# Patient Record
Sex: Male | Born: 1980 | Race: White | Hispanic: No | Smoking: Current every day smoker
Health system: Southern US, Community
[De-identification: ages and names within clinical notes are randomized; demographics above are authoritative.]

---

## 2011-08-07 ENCOUNTER — Other Ambulatory Visit: Payer: Self-pay | Admitting: Internal Medicine

## 2011-08-07 DIAGNOSIS — R748 Abnormal levels of other serum enzymes: Secondary | ICD-10-CM

## 2011-08-13 ENCOUNTER — Ambulatory Visit
Admission: RE | Admit: 2011-08-13 | Discharge: 2011-08-13 | Disposition: A | Discharge status: Home / Self Care | Payer: 59 | Source: Ambulatory Visit | Attending: Internal Medicine | Admitting: Internal Medicine

## 2011-08-13 DIAGNOSIS — R748 Abnormal levels of other serum enzymes: Secondary | ICD-10-CM

## 2013-05-31 IMAGING — US US ABDOMEN COMPLETE
1 series · 14 of 25 positions shown · non-contrast
Comparison: None.

CLINICAL DATA: Elevated LFTs

COMPLETE ABDOMINAL ULTRASOUND

[Series 1: us abdomen complete · 0.34mm/px · 14 of 84 slices shown]
[im 1/84]
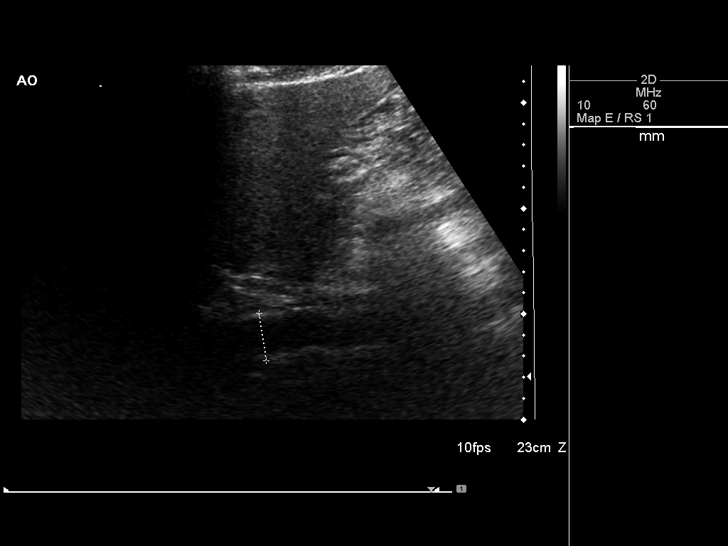
[im 7/84]
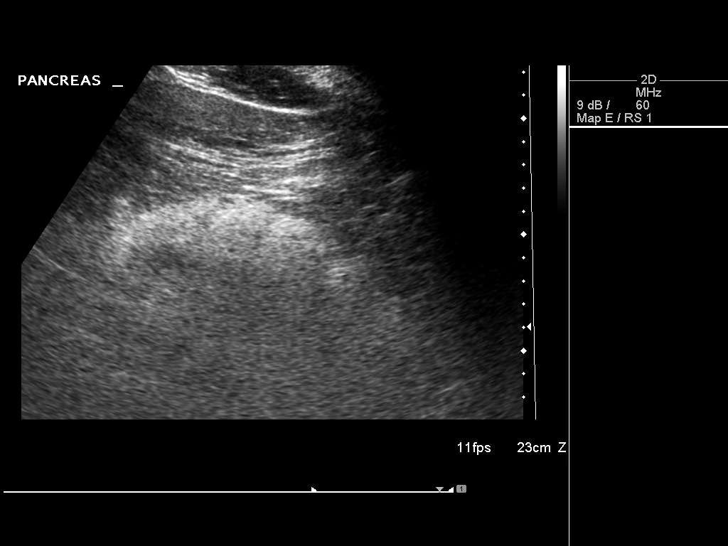
[im 14/84]
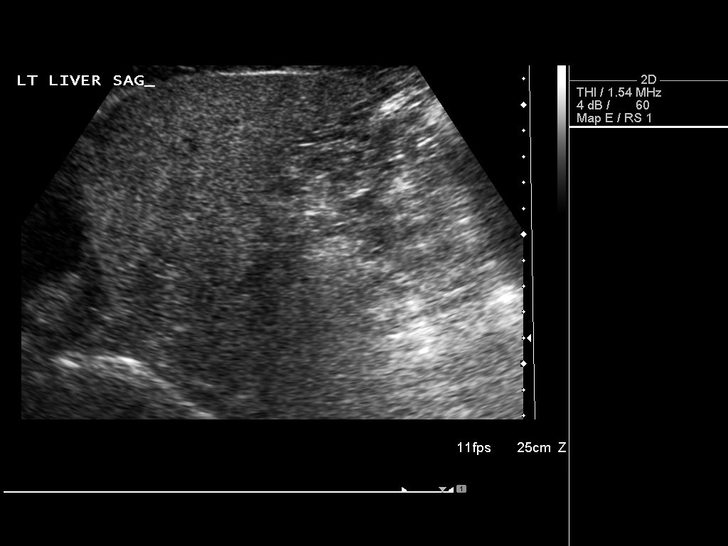
[im 21/84]
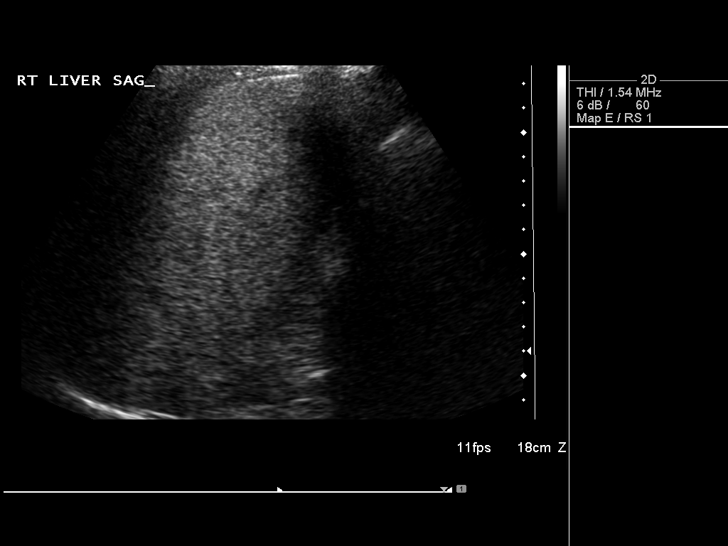
[im 28/84]
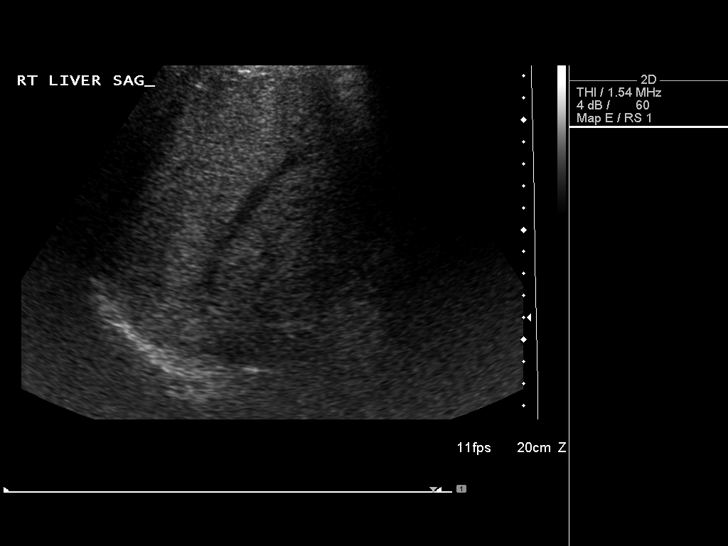
[im 32/84]
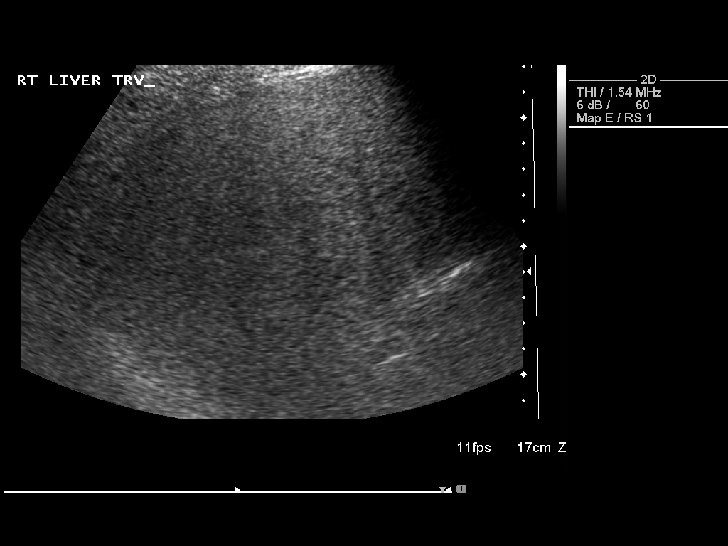
[im 39/84]
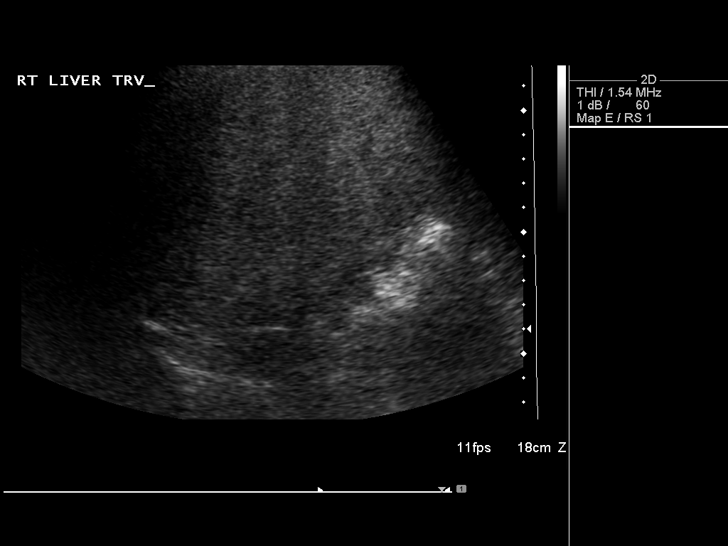
[im 45/84]
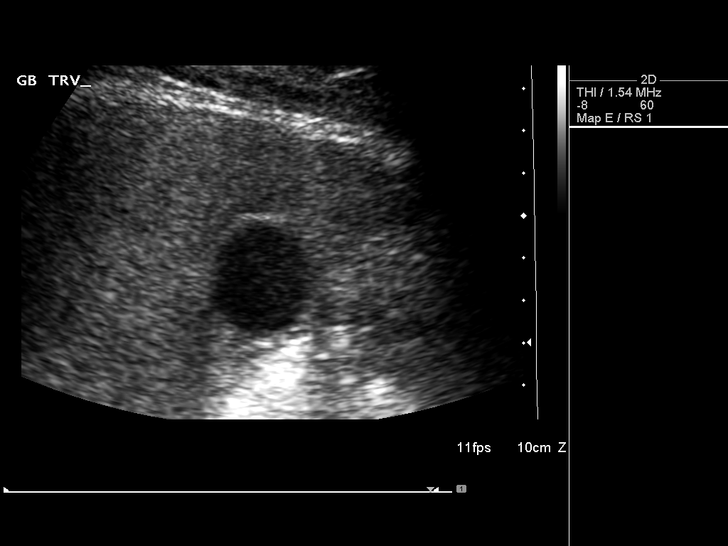
[im 52/84]
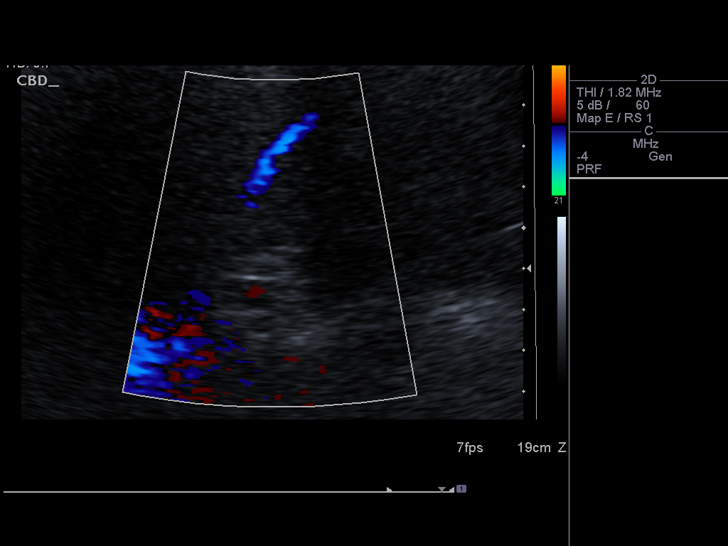
[im 56/84]
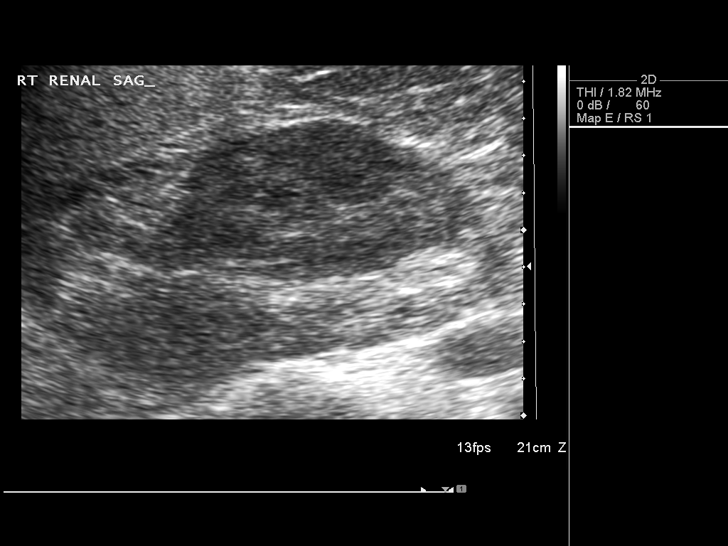
[im 63/84]
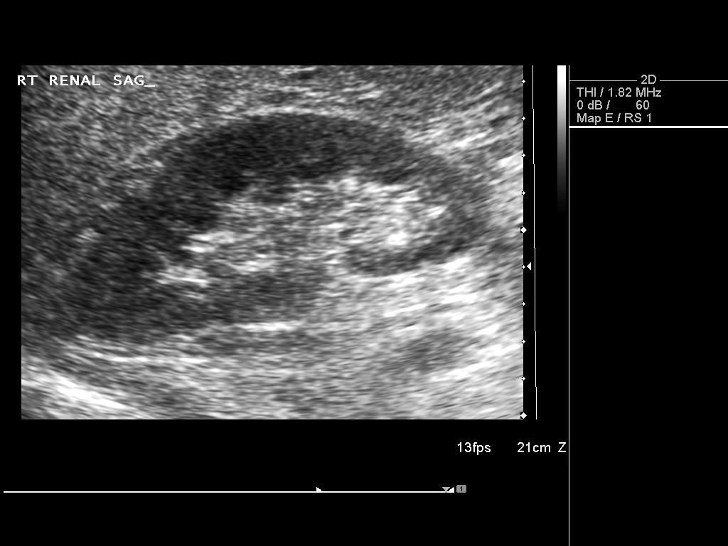
[im 70/84]
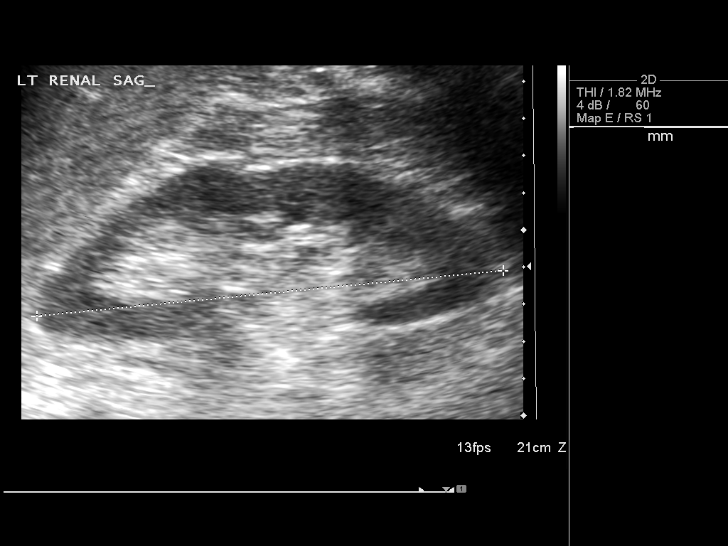
[im 77/84]
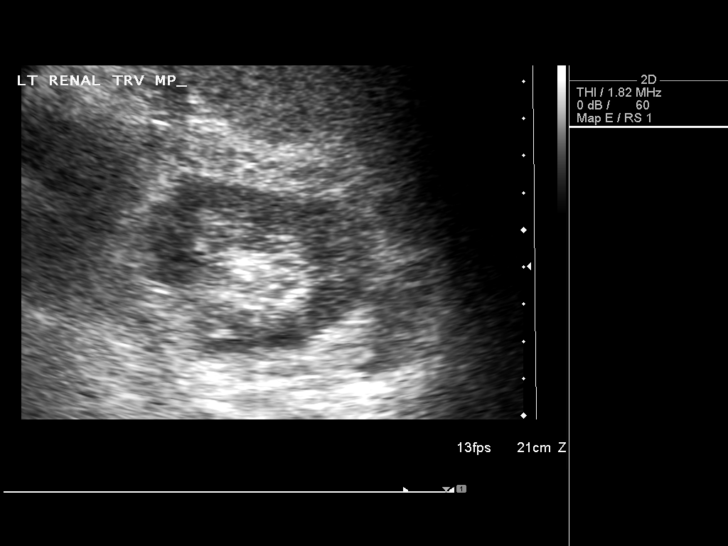
[im 84/84]
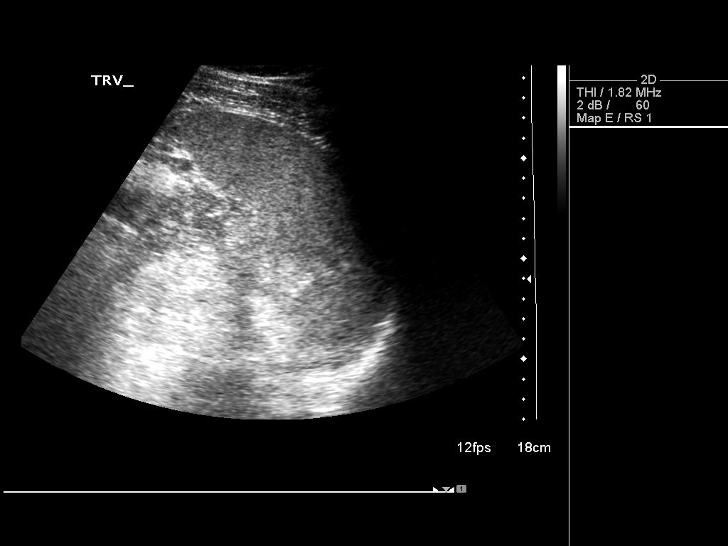

[14 of 25 positions shown; findings below may reference images not displayed]

FINDINGS: Gallbladder:  No shadowing gallstones or echogenic sludge.  No
gallbladder wall thickening or pericholecystic fluid.  Negative
sonographic Murphy's sign according to the ultrasound technologist.

Common bile duct:  3.5 mm diameter, unremarkable

Liver:  Normal size and echotexture without focal parenchymal
abnormality.  Patent portal vein with hepatopetal flow.

IVC:  Appears normal.

Pancreas:  Although the pancreas is difficult to visualize in its
entirety, no focal pancreatic abnormality is identified.

Spleen:  9.4 cm craniocaudal length, unremarkable.

Right Kidney:  11.9 cm. No hydronephrosis.  Well-preserved cortex.
Normal size and parenchymal echotexture without focal
abnormalities.

Left Kidney:  12.6 cm. No hydronephrosis.  Well-preserved cortex.
Normal size and parenchymal echotexture without focal
abnormalities. The

Abdominal aorta:  2.4 cm maximal diameter, segments obscured by
overlying bowel gas.
IMPRESSION: 1.  Negative.

## 2018-07-02 ENCOUNTER — Encounter: Payer: Self-pay | Admitting: Physician Assistant

## 2018-07-02 ENCOUNTER — Ambulatory Visit (INDEPENDENT_AMBULATORY_CARE_PROVIDER_SITE_OTHER): Payer: 59 | Admitting: Physician Assistant

## 2018-07-02 ENCOUNTER — Other Ambulatory Visit: Payer: Self-pay

## 2018-07-02 VITALS — BP 147/98 | HR 90 | Temp 98.7°F | Resp 18 | Ht 74.0 in | Wt 225.6 lb

## 2018-07-02 DIAGNOSIS — I10 Essential (primary) hypertension: Secondary | ICD-10-CM | POA: Diagnosis not present

## 2018-07-02 DIAGNOSIS — F411 Generalized anxiety disorder: Secondary | ICD-10-CM | POA: Insufficient documentation

## 2018-07-02 DIAGNOSIS — G4730 Sleep apnea, unspecified: Secondary | ICD-10-CM | POA: Diagnosis not present

## 2018-07-02 MED ORDER — OLMESARTAN MEDOXOMIL 20 MG PO TABS: 20.0000 mg | ORAL_TABLET | Freq: Every day | ORAL | 2 refills | Status: AC

## 2018-07-02 MED ORDER — SERTRALINE HCL 50 MG PO TABS: 50.0000 mg | ORAL_TABLET | Freq: Every day | ORAL | 1 refills | Status: AC

## 2018-07-02 NOTE — Progress Notes (Signed)
Nicholas Alvarado  MRN: [id number] DOB: [birth year]  PCP: Patient, No Pcp Per  Chief Complaint  Patient presents with  . Anxiety  . Depression    zoloft   . Hypertension  . Neck Pain    popping of the neck     Subjective:  Pt presents to clinic for medication initiation for generalized anxiety disorder.  He [facility].  Dx with GAD about 1.5 years ago - feeling of unease - uncomfortable inside his body.  Upcoming wedding, engagement party end of Sept Caregiver of finance -- problems with clutter and he knows that is starting in his house with all the event coming up.  Wellbutrin in the past for smoking sensation - but did help his mood  Gets frustrated easily which can lead to anger No SI/HI  No social anxiety.  Trouble sleeping - due to fiance not sleeping well.  She tends to sleep a lot during the day and then is unable to sleep at night.  She does a lot of watching television in bed.  They just recently had a conversation that if she is not ready to go to bed by certain time then he will go into the gastrin because of sleep is important to him, as he needs to get up for work.  EOTH - 2 drinks a day - likes beer taste - does not use it as a treatment Smoke - 1/4-1/2 ppd - uses for anxiety - if he gets frustrated leaves the situation to smoke Drugs  - occ marj - social - once every 2 months  Mom - anxiety problems  He is also worried about his blood pressure.  At last several visits with other providers he has been told his blood pressure is high but no one ever initiates medications.  Spoke with Noah Delaine.  She has been seeing patients for generalized anxiety.  He has been on Wellbutrin in the past which he felt like it helped.  He has a lot of situational stressors with upcoming wedding and fianc with multiple medical problems.  She feels like he would benefit from medication though concerned about  Wellbutrin with his current anxiety state.   History is obtained by patient.  Review of Systems  Psychiatric/Behavioral: Positive for sleep disturbance (Due to fianc being awake and active in bed at night.). The patient is nervous/anxious.     Patient Active Problem List   Diagnosis Date Noted  . GAD (generalized anxiety disorder) 07/02/2018  . HTN (hypertension) 07/02/2018  . Sleep apnea 07/02/2018    Current Outpatient Medications on File Prior to Visit  Medication Sig Dispense Refill  . finasteride (PROPECIA) 1 MG tablet Take 1 mg by mouth daily.    . fluticasone (FLONASE) 50 MCG/ACT nasal spray Place into the nose.    . minoxidil (ROGAINE) 2 % external solution Apply topically 2 (two) times daily.    Marland Kitchen omeprazole (PRILOSEC) 20 MG capsule Take by mouth.     No current facility-administered medications on file prior to visit.     No Known Allergies  No past medical history on file. Social History   Social History Narrative  . Not on file   Social History   Tobacco Use  . Smoking status: Current Every Day Smoker    Types: Cigarettes  . Smokeless tobacco: Never Used  Substance Use Topics  . Alcohol use: Not on file  . Drug use:  Not on file   family history is not on file.     Objective:  BP (!) 147/98   Pulse 90   Temp 98.7 F (37.1 C) (Oral)   Resp 18   Ht _0  (1.88 m)   Wt 225 lb 9.6 oz (102.3 kg)   SpO2 98%   BMI 28.97 kg/m  Body mass index is 28.97 kg/m.  Wt Readings from Last 3 Encounters:  07/02/18 225 lb 9.6 oz (102.3 kg)    Physical Exam  Constitutional: He is oriented to person, place, and time. He appears well-developed and well-nourished.  HENT:  Head: Normocephalic and atraumatic.  Right Ear: External ear normal.  Left Ear: External ear normal.  Eyes: Conjunctivae are normal.  Neck: Normal range of motion.  Cardiovascular: Normal rate, regular rhythm and normal heart sounds.  No murmur heard. Pulmonary/Chest: Effort normal and  breath sounds normal. He has no wheezes.  Neurological: He is alert and oriented to person, place, and time.  Skin: Skin is warm and dry.  Psychiatric: Judgment normal.  Vitals reviewed.   Assessment and Plan :  GAD (generalized anxiety disorder) - Plan: TSH, sertraline (ZOLOFT) 50 MG tablet  Essential hypertension - Plan: CMP14+EGFR, TSH, olmesartan (BENICAR) 20 MG tablet  Sleep apnea, unspecified type   Check labs.  Initiate Zoloft for generalized anxiety disorder expect to need to increase dose over the next 4 to 6 weeks.  Start Benicar 20 mg for blood pressure.  Recheck with me at new clinic.  Continue therapy.  Patient verbalized to me that they understand the following: diagnosis, what is being done for them, what to expect and what should be done at home.  Their questions have been answered.  See after visit summary for patient specific instructions.  Windell Hummingbird PA-C  [facility] 07/08/2018 9:56 PM  Please note: Portions of this report may have been transcribed using dragon voice recognition software. Every effort was made to ensure accuracy; however, inadvertent computerized transcription errors may be present.

## 2018-07-02 NOTE — Patient Instructions (Addendum)
Zoloft - start with a 1/2 pill for 6 days - if tolerating well increase to a full pill  For your BP - start benicar 20mg  1 a day -   [facility] - [address], [place], [place] [address] Phone: [phone]   If you have lab work done today you will be contacted with your lab results within the next 2 weeks.  If you have not heard from [place] then please contact us. The fastest way to get your results is to register for My Chart.   IF you received an x-ray today, you will receive an invoice from [facility]. Please contact [facility] at [phone] with questions or concerns regarding your invoice.   IF you received labwork today, you will receive an invoice from [facility]. Please contact [facility] at [phone] with questions or concerns regarding your invoice.   Our billing staff will not be able to assist you with questions regarding bills from these companies.  You will be contacted with the lab results as soon as they are available. The fastest way to get your results is to activate your My Chart account. Instructions are located on the last page of this paperwork. If you have not heard from [place] regarding the results in 2 weeks, please contact this office.

## 2018-07-03 LAB — CMP14+EGFR
ALT: 34 IU/L (ref 0–44)
AST: 20 IU/L (ref 0–40)
Albumin/Globulin Ratio: 1.8 (ref 1.2–2.2)
Albumin: 4.6 g/dL (ref 3.5–5.5)
Alkaline Phosphatase: 64 IU/L (ref 39–117)
BUN/Creatinine Ratio: 11 (ref 9–20)
BUN: 12 mg/dL (ref 6–20)
Bilirubin Total: 1 mg/dL (ref 0.0–1.2)
CALCIUM: 9.6 mg/dL (ref 8.7–10.2)
CO2: 22 mmol/L (ref 20–29)
Chloride: 104 mmol/L (ref 96–106)
Creatinine, Ser: 1.06 mg/dL (ref 0.76–1.27)
GFR, EST AFRICAN AMERICAN: 104 mL/min/{1.73_m2} (ref >59)
GFR, EST NON AFRICAN AMERICAN: 90 mL/min/{1.73_m2} (ref >59)
GLUCOSE: 97 mg/dL (ref 65–99)
Globulin, Total: 2.6 g/dL (ref 1.5–4.5)
Potassium: 4.4 mmol/L (ref 3.5–5.2)
Sodium: 144 mmol/L (ref 134–144)
TOTAL PROTEIN: 7.2 g/dL (ref 6.0–8.5)

## 2018-07-03 LAB — TSH: TSH: 1.23 u[IU]/mL (ref 0.450–4.500)

## 2023-07-23 ENCOUNTER — Other Ambulatory Visit: Payer: Self-pay

## 2023-07-23 ENCOUNTER — Ambulatory Visit (HOSPITAL_BASED_OUTPATIENT_CLINIC_OR_DEPARTMENT_OTHER): Payer: BC Managed Care – PPO | Attending: Physician Assistant | Admitting: Physical Therapy

## 2023-07-23 DIAGNOSIS — M542 Cervicalgia: Secondary | ICD-10-CM

## 2023-07-23 DIAGNOSIS — M5030 Other cervical disc degeneration, unspecified cervical region: Secondary | ICD-10-CM | POA: Diagnosis present

## 2023-07-23 NOTE — Therapy (Signed)
OUTPATIENT PHYSICAL THERAPY CERVICAL EVALUATION   Patient Name: Nicholas Alvarado MRN: [id number] DOB:[birth year], 42 y.o., male Today's Date: 07/24/2023  END OF SESSION:  PT End of Session - 07/23/23 1500     Visit Number 1    Number of Visits 8    Date for PT Re-Evaluation 09/03/23    Authorization Type BCBS    PT Start Time 1322    PT Stop Time 1413    PT Time Calculation (min) 51 min    Activity Tolerance Patient tolerated treatment well    Behavior During Therapy [facility] for tasks assessed/performed             No past medical history on file. *** The histories are not reviewed yet. Please review them in the "History" navigator section and refresh this SmartLink. Patient Active Problem List   Diagnosis Date Noted   GAD (generalized anxiety disorder) 07/02/2018   HTN (hypertension) 07/02/2018   Sleep apnea 07/02/2018     REFERRING PROVIDER: Morrell Riddle, PA-C  REFERRING DIAG: M50.30 (ICD-10-CM) - Other cervical disc degeneration, unspecified cervical region  THERAPY DIAG:  Cervicalgia  Rationale for Evaluation and Treatment: Rehabilitation  ONSET DATE: PT order 06/18/2023  SUBJECTIVE:                                                                                                                                                                                                         SUBJECTIVE STATEMENT: Pt reports having stiffness and pain in cervical for years.  He denies any specific MOI.  He states the pain is in his muscles, not as much the spine.  Pt states he gets a lot of tension in his neck and upper traps which he seems to not be able to get rid of.  Pt states he has to pop his neck constantly a lot to relieve the tension.  He is limited with turning head when he is hurting.  Pt has pain with turning head while driving.  He feels pressure in neck with sitting.  Pt states he can sit 15 mins before he starts having the pressure.  Pt received PT at [facility] last  year and reports the dry needling helped.  He has a [employer] home cervical traction unit.      Pt performed the [employer] program which is an app for online PT.  He states he received very slight benefits though did learn some stretches.    Pt is a member at [employer] and works out in [employer] 2x/wk.  He has a  program from a trainer incorporating lower and upper body workouts.     Hand dominance: Right  PERTINENT HISTORY:  HTN, GAD  PAIN:  NPRS:  Pt states he mainly stays at a 3/10 constant pain/level of discomfort which can increase to 6-7/10.  Worst 6-7/10. Best 3/10. Location: cervical/UT Pt can occasionally have L shoulder pain.  Pt can have tingling in L UE in sitting and also playing video games.   Easing Factors:  naproxen, CBD cream, traction unit, self-popping his neck Aggravating factors:  sitting, turning neck, occasionally reaching up to grab an object  PRECAUTIONS: None     WEIGHT BEARING RESTRICTIONS: No  FALLS:  Has patient fallen in last 6 months? 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  Pt tries to get up every hour.  PLOF: Independent  PATIENT GOALS: reduce daily uncomfortable feeling.  Have tools to decrease pain.  Decrease the tension in neck/shoulders.  Reduce the need to pop his neck.   OBJECTIVE:  DIAGNOSTIC FINDINGS:   Note:  X rays in 10/23:   FINDINGS: There is straightening of the cervical spine which is similar to the prior exam. The vertebral body heights are maintained. No fracture identified. Mild disc space narrowing with anterior osteophytes at C5-C6. Mild disc space narrowing at C6-C7.  No prevertebral soft tissue swelling.   IMPRESSION: Degenerative disc disease at C5-C6 and C6-C7 which is similar to the prior exam.     PATIENT SURVEYS:  FOTO 62 with a goal of 70 at visit 11  COGNITION: Overall cognitive status: Within functional limits for tasks assessed  SENSATION: LT 2+ t/o bilat UE  dermatomes except 1+ on L C6   PALPATION: Soft tissue tightness present in bilat UT.    CERVICAL ROM:   Active ROM A/PROM (deg) eval  Flexion WNL  Extension 60  Right lateral flexion 40  Left lateral flexion 37  Right rotation WNL pain in L sided cervical  Left rotation WNL pain in L sided cervical   (Blank rows = not tested)  UPPER EXTREMITY ROM:  Active ROM Right eval Left eval  Shoulder flexion [facility]  Shoulder extension    Shoulder abduction [facility]  Shoulder adduction    Shoulder extension    Shoulder internal rotation    Shoulder external rotation    Elbow flexion    Elbow extension    Wrist flexion    Wrist extension    Wrist ulnar deviation    Wrist radial deviation    Wrist pronation    Wrist supination     (Blank rows = not tested)  UPPER EXTREMITY MMT:  MMT Right eval Left eval  Shoulder flexion 5/5 5/5  Shoulder extension    Shoulder abduction 5/5 5/5  Shoulder adduction    Shoulder extension    Shoulder internal rotation    Shoulder external rotation    Middle trapezius    Lower trapezius    Elbow flexion 5/5 5/5  Elbow extension    Wrist flexion    Wrist extension    Wrist ulnar deviation    Wrist radial deviation    Wrist pronation    Wrist supination    Grip strength     (Blank rows = not tested)  CERVICAL SPECIAL TESTS:  Compression test: negative Spurlings Test:  Pain on L side with L sided testing and pain on R side with R sided testing.  Pt denies any shooting  pain or radicular sx's.   TODAY'S TREATMENT:                                                                                                                               Pt demonstrated his home program.  He is performing cervical AROM in flexion, extension, SB, and rotation with 4 sec holds at home.  PATIENT EDUCATION:  Education details: PT educated pt with using a lumbar towel roll in chair for improved posture.  PT demonstrated correct posture and educated pt  in the importance of correct posture.  PT educated pt in using a theracane.  Dx, objective findings, rationale of interventions, POC, and relevant anatomy. Person educated: Patient Education method: Medical illustrator Education comprehension: verbalized understanding, returned demonstration, and needs further education  HOME EXERCISE PROGRAM: Will give next visit  ASSESSMENT:  CLINICAL IMPRESSION: Patient is a 42 y.o. male with a dx of cervical disc degeneration presenting to the clinic   He is limited with turning head when he is hurting.  Pt has pain with turning head while driving.  He feels pressure in neck with sitting.  Pt states he can sit 15 mins before he starts having the pressure. sitting, turning neck, occasionally reaching up to grab an object  OBJECTIVE IMPAIRMENTS: {opptimpairments:25111}.   ACTIVITY LIMITATIONS: {activitylimitations:27494}  PARTICIPATION LIMITATIONS: {participationrestrictions:25113}  PERSONAL FACTORS: {Personal factors:25162} are also affecting patient's functional outcome.   REHAB POTENTIAL: Good  CLINICAL DECISION MAKING: Stable/uncomplicated  EVALUATION COMPLEXITY: Low   GOALS:   SHORT TERM GOALS: Target date: ***  *** Baseline:  Goal status: INITIAL  2.  *** Baseline:  Goal status: INITIAL  3.  *** Baseline:  Goal status: INITIAL  4.  *** Baseline:  Goal status: INITIAL  5.  *** Baseline:  Goal status: INITIAL  6.  *** Baseline:  Goal status: INITIAL  LONG TERM GOALS: Target date: ***  *** Baseline:  Goal status: INITIAL  2.  *** Baseline:  Goal status: INITIAL  3.  *** Baseline:  Goal status: INITIAL  4.  *** Baseline:  Goal status: INITIAL  5.  *** Baseline:  Goal status: INITIAL  6.  *** Baseline:  Goal status: INITIAL   PLAN:  PT FREQUENCY: 1-2x/week  PT DURATION: 6 weeks  PLANNED INTERVENTIONS: {rehab planned interventions:25118::"Therapeutic exercises","Therapeutic  activity","Neuromuscular re-education","Balance training","Gait training","Patient/Family education","Self Care","Joint mobilization"}  PLAN FOR NEXT SESSION: Aaron Edelman, PT 07/24/2023, 9:10 PM

## 2023-07-25 ENCOUNTER — Encounter (HOSPITAL_BASED_OUTPATIENT_CLINIC_OR_DEPARTMENT_OTHER): Payer: Self-pay | Admitting: Physical Therapy

## 2023-08-01 ENCOUNTER — Ambulatory Visit (HOSPITAL_BASED_OUTPATIENT_CLINIC_OR_DEPARTMENT_OTHER): Payer: BC Managed Care – PPO | Admitting: Physical Therapy

## 2023-08-01 ENCOUNTER — Encounter (HOSPITAL_BASED_OUTPATIENT_CLINIC_OR_DEPARTMENT_OTHER): Payer: Self-pay | Admitting: Physical Therapy

## 2023-08-01 DIAGNOSIS — M5030 Other cervical disc degeneration, unspecified cervical region: Secondary | ICD-10-CM | POA: Diagnosis not present

## 2023-08-01 DIAGNOSIS — M542 Cervicalgia: Secondary | ICD-10-CM

## 2023-08-01 NOTE — Therapy (Signed)
OUTPATIENT PHYSICAL THERAPY TREATMENT   Patient Name: Nicholas Alvarado MRN: [id number] DOB:February 08, 1981, 42 y.o., male Today's Date: 08/01/2023  END OF SESSION:  PT End of Session - 08/01/23 1150     Visit Number 2    Number of Visits 8    Date for PT Re-Evaluation 09/03/23    Authorization Type BCBS    PT Start Time 1150    PT Stop Time 1230    PT Time Calculation (min) 40 min    Activity Tolerance Patient tolerated treatment well    Behavior During Therapy [facility] for tasks assessed/performed             History reviewed. No pertinent past medical history.  Patient Active Problem List   Diagnosis Date Noted   GAD (generalized anxiety disorder) 07/02/2018   HTN (hypertension) 07/02/2018   Sleep apnea 07/02/2018     REFERRING PROVIDER: Morrell Riddle, PA-C  REFERRING DIAG: M50.30 (ICD-10-CM) - Other cervical disc degeneration, unspecified cervical region  THERAPY DIAG:  Cervicalgia  Rationale for Evaluation and Treatment: Rehabilitation  ONSET DATE: PT order 06/18/2023  SUBJECTIVE:                                                                                                                                                                                                         SUBJECTIVE STATEMENT: Patient states continued symptoms from the base of scull to UT region.    EVAL: Pt reports having stiffness and pain in cervical for years.  He denies any specific MOI.  He states the pain is in his muscles, not as much the spine.  Pt states he gets a lot of tension in his neck and upper traps which he seems to not be able to get rid of.  Pt states he has to pop his neck constantly a lot to relieve the tension.  He is limited with turning head when he is hurting.  Pt has pain with turning head while driving.  He feels pressure in neck with sitting.  Pt states he can sit 15 mins before he starts having the pressure.  Pt received PT at [facility] last year and reports the dry  needling helped.  He has a [employer] home cervical traction unit.      Pt performed the [employer] program which is an app for online PT.  He states he received very slight benefits though did learn some stretches.    Pt is a member at [employer] and works out in [employer] 2x/wk.  He has a program from  a trainer incorporating lower and upper body workouts.     Hand dominance: Right  PERTINENT HISTORY:  HTN, GAD  PAIN:  NPRS:  Current 5 /10Worst 6-7/10. Best 3/10. Location: cervical/UT Pt can occasionally have L shoulder pain.  Pt can have tingling in L UE in sitting and also playing video games.   Easing Factors:  naproxen, CBD cream, traction unit, self-popping his neck Aggravating factors:  sitting, turning neck, occasionally reaching up to grab an object  PRECAUTIONS: None     WEIGHT BEARING RESTRICTIONS: No  FALLS:  Has patient fallen in last 6 months? 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  Pt tries to get up every hour.  PLOF: Independent  PATIENT GOALS: reduce daily uncomfortable feeling.  Have tools to decrease pain.  Decrease the tension in neck/shoulders.  Reduce the need to pop his neck.   OBJECTIVE:  DIAGNOSTIC FINDINGS:   Note:  X rays in 10/23:   FINDINGS: There is straightening of the cervical spine which is similar to the prior exam. The vertebral body heights are maintained. No fracture identified. Mild disc space narrowing with anterior osteophytes at C5-C6. Mild disc space narrowing at C6-C7.  No prevertebral soft tissue swelling.   IMPRESSION: Degenerative disc disease at C5-C6 and C6-C7 which is similar to the prior exam.     PATIENT SURVEYS:  FOTO 62 with a goal of 70 at visit 11  COGNITION: Overall cognitive status: Within functional limits for tasks assessed  SENSATION: LT 2+ t/o bilat UE dermatomes except 1+ on L C6   PALPATION: Soft tissue tightness present in bilat UT.    CERVICAL ROM:   Active  ROM A/PROM (deg) eval  Flexion WNL  Extension 60  Right lateral flexion 40  Left lateral flexion 37  Right rotation WNL pain in L sided cervical  Left rotation WNL pain in L sided cervical   (Blank rows = not tested)  UPPER EXTREMITY ROM:  Active ROM Right eval Left eval  Shoulder flexion [facility]  Shoulder extension    Shoulder abduction [facility]  Shoulder adduction    Shoulder extension    Shoulder internal rotation    Shoulder external rotation    Elbow flexion    Elbow extension    Wrist flexion    Wrist extension    Wrist ulnar deviation    Wrist radial deviation    Wrist pronation    Wrist supination     (Blank rows = not tested)  UPPER EXTREMITY MMT:  MMT Right eval Left eval  Shoulder flexion 5/5 5/5  Shoulder extension    Shoulder abduction 5/5 5/5  Shoulder adduction    Shoulder extension    Shoulder internal rotation    Shoulder external rotation    Middle trapezius    Lower trapezius    Elbow flexion 5/5 5/5  Elbow extension    Wrist flexion    Wrist extension    Wrist ulnar deviation    Wrist radial deviation    Wrist pronation    Wrist supination    Grip strength     (Blank rows = not tested)  CERVICAL SPECIAL TESTS:  Compression test: negative Spurlings Test:  Pain on L side with L sided testing and pain on R side with R sided testing.  Pt denies any shooting pain or radicular sx's.   TODAY'S TREATMENT:  08/01/23 TTP bilateral cervical paraspinals  and UT  Manual: STM to bilateral UT cervical paraspinals pre and post dry needling for trigger point identification and muscular relaxation. Trigger Point Dry-Needling  Treatment instructions: Expect mild to moderate muscle soreness. S/S of pneumothorax if dry needled over a lung field, and to seek immediate medical attention should they occur. Patient verbalized  understanding of these instructions and education.  Patient Consent Given: Yes Education handout provided: Yes Muscles treated: bilateral UT, R cervical paraspinals Electrical stimulation performed: No Parameters: N/A Treatment response/outcome: twitch response   Seated SNAG rotation 10 x 5 second holds bilateral Seated cervical retraction 2 x 10  Seated UT stretch 3 x 20 second holds bilateral  EVAL Pt demonstrated his home program.  He is performing cervical AROM in flexion, extension, SB, and rotation with 4 sec holds at home. See below for pt education.  PATIENT EDUCATION:  Education details: PT educated pt with using a lumbar towel roll in chair for improved posture.  PT demonstrated correct posture and educated pt in the importance of correct posture.  PT educated pt in using a theracane.  Dx, objective findings, rationale of interventions, POC, and relevant anatomy. Person educated: Patient Education method: Medical illustrator Education comprehension: verbalized understanding, returned demonstration, and needs further education  HOME EXERCISE PROGRAM: Access Code: [email or web address] URL: [email or web address] Date: 08/01/2023  - Seated Assisted Cervical Rotation with Towel  - 3 x daily - 7 x weekly - 10 reps - 5 second hold - Seated Cervical Retraction  - 3-5 x daily - 7 x weekly - 2 sets - 10 reps - Seated Upper Trapezius Stretch  - 3 x daily - 7 x weekly - 3 reps - 20 second hold  ASSESSMENT:  CLINICAL IMPRESSION: Patient with hyperactive and tender UT and cervical paraspinals. Performed dry needling with decrease in tissue tension, twitch response and decrease in symptoms to 2/10. Cervical mobility and postural strengthening tolerated well.  Patient will continue to benefit from physical therapy in order to improve function and reduce impairment.   OBJECTIVE IMPAIRMENTS: decreased ROM, increased fascial restrictions, impaired flexibility, impaired UE  functional use, and pain.   ACTIVITY LIMITATIONS: sitting and reach over head  PARTICIPATION LIMITATIONS: driving and occupation  PERSONAL FACTORS: Time since onset of injury/illness/exacerbation are also affecting patient's functional outcome.   REHAB POTENTIAL: Good  CLINICAL DECISION MAKING: Stable/uncomplicated  EVALUATION COMPLEXITY: Low   GOALS:   SHORT TERM GOALS: Target date: 08/13/2023   Pt will be independent and compliant with HEP for improved pain, strength, and function.  Baseline:  Goal status: INITIAL  2.  Pt will report at least a 25% improvement in pain and sx's overall.  Baseline:  Goal status: INITIAL  3.  Pt will report reduced need of self-popping his neck for reduced tightness/pressure. Baseline:  Goal status: INITIAL Target date:  08/20/2023    LONG TERM GOALS: Target date: 09/13/2023  Pt's worst pain will be no > than 3/10.   Baseline:  Goal status: INITIAL  2.  Pt will report he is able to turn head while driving without limitation and without pain.  Baseline:  Goal status: INITIAL  3.  Pt will report at least a 70% improvement overall in sx's including with sitting.   Baseline:  Goal status: INITIAL  4.  Pt will report he is able to consistently reach overhead without pain.  Baseline:  Goal status: INITIAL    PLAN:  PT FREQUENCY: 1-2x/week  PT DURATION: 6 weeks  PLANNED INTERVENTIONS: Therapeutic exercises, Therapeutic activity, Neuromuscular re-education, Patient/Family education, Self Care, Joint mobilization, Joint manipulation, Stair training, Aquatic Therapy, Dry Needling, Electrical stimulation, Spinal mobilization, Cryotherapy, Moist heat, Taping, Traction, Ultrasound, Manual therapy, and Re-evaluation  PLAN FOR NEXT SESSION: STW to UT, cervical paraspinals, and suboccipitals.  Postural strengthening and stabilization.     11:50 AM, 08/01/23 Wyman Songster PT, DPT Physical Therapist at [facility]

## 2023-08-08 ENCOUNTER — Ambulatory Visit (HOSPITAL_BASED_OUTPATIENT_CLINIC_OR_DEPARTMENT_OTHER): Payer: BC Managed Care – PPO | Admitting: Physical Therapy

## 2023-08-08 ENCOUNTER — Encounter (HOSPITAL_BASED_OUTPATIENT_CLINIC_OR_DEPARTMENT_OTHER): Payer: Self-pay | Admitting: Physical Therapy

## 2023-08-08 DIAGNOSIS — M542 Cervicalgia: Secondary | ICD-10-CM

## 2023-08-08 DIAGNOSIS — M5030 Other cervical disc degeneration, unspecified cervical region: Secondary | ICD-10-CM | POA: Diagnosis not present

## 2023-08-08 NOTE — Therapy (Signed)
OUTPATIENT PHYSICAL THERAPY TREATMENT   Patient Name: Nicholas Alvarado MRN: [id number] DOB:21-Jun-1981, 42 y.o., male Today's Date: 08/08/2023  END OF SESSION:  PT End of Session - 08/08/23 1350     Visit Number 3    Number of Visits 8    Date for PT Re-Evaluation 09/03/23    Authorization Type BCBS    PT Start Time 1359   arrives late   PT Stop Time 1430    PT Time Calculation (min) 31 min    Activity Tolerance Patient tolerated treatment well    Behavior During Therapy [facility] for tasks assessed/performed             History reviewed. No pertinent past medical history.  Patient Active Problem List   Diagnosis Date Noted   GAD (generalized anxiety disorder) 07/02/2018   HTN (hypertension) 07/02/2018   Sleep apnea 07/02/2018     REFERRING PROVIDER: Morrell Riddle, PA-C  REFERRING DIAG: M50.30 (ICD-10-CM) - Other cervical disc degeneration, unspecified cervical region  THERAPY DIAG:  Cervicalgia  Rationale for Evaluation and Treatment: Rehabilitation  ONSET DATE: PT order 06/18/2023  SUBJECTIVE:                                                                                                                                                                                                         SUBJECTIVE STATEMENT: Patient states tight on outsides of neck.    EVAL: Pt reports having stiffness and pain in cervical for years.  He denies any specific MOI.  He states the pain is in his muscles, not as much the spine.  Pt states he gets a lot of tension in his neck and upper traps which he seems to not be able to get rid of.  Pt states he has to pop his neck constantly a lot to relieve the tension.  He is limited with turning head when he is hurting.  Pt has pain with turning head while driving.  He feels pressure in neck with sitting.  Pt states he can sit 15 mins before he starts having the pressure.  Pt received PT at [facility] last year and reports the dry needling helped.   He has a [employer] home cervical traction unit.      Pt performed the [employer] program which is an app for online PT.  He states he received very slight benefits though did learn some stretches.    Pt is a member at [employer] and works out in [employer] 2x/wk.  He has a program from a trainer  incorporating lower and upper body workouts.     Hand dominance: Right  PERTINENT HISTORY:  HTN, GAD  PAIN:  NPRS:  Current 0 /10Worst 6-7/10. Best 3/10. Location: cervical/UT Pt can occasionally have L shoulder pain.  Pt can have tingling in L UE in sitting and also playing video games.   Easing Factors:  naproxen, CBD cream, traction unit, self-popping his neck Aggravating factors:  sitting, turning neck, occasionally reaching up to grab an object  PRECAUTIONS: None     WEIGHT BEARING RESTRICTIONS: No  FALLS:  Has patient fallen in last 6 months? 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  Pt tries to get up every hour.  PLOF: Independent  PATIENT GOALS: reduce daily uncomfortable feeling.  Have tools to decrease pain.  Decrease the tension in neck/shoulders.  Reduce the need to pop his neck.   OBJECTIVE:  DIAGNOSTIC FINDINGS:   Note:  X rays in 10/23:   FINDINGS: There is straightening of the cervical spine which is similar to the prior exam. The vertebral body heights are maintained. No fracture identified. Mild disc space narrowing with anterior osteophytes at C5-C6. Mild disc space narrowing at C6-C7.  No prevertebral soft tissue swelling.   IMPRESSION: Degenerative disc disease at C5-C6 and C6-C7 which is similar to the prior exam.     PATIENT SURVEYS:  FOTO 62 with a goal of 70 at visit 11  COGNITION: Overall cognitive status: Within functional limits for tasks assessed  SENSATION: LT 2+ t/o bilat UE dermatomes except 1+ on L C6   PALPATION: Soft tissue tightness present in bilat UT.    CERVICAL ROM:   Active ROM A/PROM  (deg) eval  Flexion WNL  Extension 60  Right lateral flexion 40  Left lateral flexion 37  Right rotation WNL pain in L sided cervical  Left rotation WNL pain in L sided cervical   (Blank rows = not tested)  UPPER EXTREMITY ROM:  Active ROM Right eval Left eval  Shoulder flexion [facility]  Shoulder extension    Shoulder abduction [facility]  Shoulder adduction    Shoulder extension    Shoulder internal rotation    Shoulder external rotation    Elbow flexion    Elbow extension    Wrist flexion    Wrist extension    Wrist ulnar deviation    Wrist radial deviation    Wrist pronation    Wrist supination     (Blank rows = not tested)  UPPER EXTREMITY MMT:  MMT Right eval Left eval  Shoulder flexion 5/5 5/5  Shoulder extension    Shoulder abduction 5/5 5/5  Shoulder adduction    Shoulder extension    Shoulder internal rotation    Shoulder external rotation    Middle trapezius    Lower trapezius    Elbow flexion 5/5 5/5  Elbow extension    Wrist flexion    Wrist extension    Wrist ulnar deviation    Wrist radial deviation    Wrist pronation    Wrist supination    Grip strength     (Blank rows = not tested)  CERVICAL SPECIAL TESTS:  Compression test: negative Spurlings Test:  Pain on L side with L sided testing and pain on R side with R sided testing.  Pt denies any shooting pain or radicular sx's.   TODAY'S TREATMENT:  08/08/23 TTP L cervical paraspinals Manual STM to L cervical paraspinals, L to R lateral glide C4-C6; STM L UT, pin and stretch UT bilateral Shoulder horizontal abduction 1 x 10 Bilateral ER 1 x 10  Shoulder PNF D2 1 x 10   08/01/23 TTP bilateral cervical paraspinals  and UT  Manual: STM to bilateral UT cervical paraspinals pre and post dry needling for trigger point identification and muscular relaxation. Trigger  Point Dry-Needling  Treatment instructions: Expect mild to moderate muscle soreness. S/S of pneumothorax if dry needled over a lung field, and to seek immediate medical attention should they occur. Patient verbalized understanding of these instructions and education.  Patient Consent Given: Yes Education handout provided: Yes Muscles treated: bilateral UT, R cervical paraspinals Electrical stimulation performed: No Parameters: N/A Treatment response/outcome: twitch response   Seated SNAG rotation 10 x 5 second holds bilateral Seated cervical retraction 2 x 10  Seated UT stretch 3 x 20 second holds bilateral  EVAL Pt demonstrated his home program.  He is performing cervical AROM in flexion, extension, SB, and rotation with 4 sec holds at home. See below for pt education.  PATIENT EDUCATION:  Education details: PT educated pt with using a lumbar towel roll in chair for improved posture.  PT demonstrated correct posture and educated pt in the importance of correct posture.  PT educated pt in using a theracane.  Dx, objective findings, rationale of interventions, POC, and relevant anatomy. Person educated: Patient Education method: Medical illustrator Education comprehension: verbalized understanding, returned demonstration, and needs further education  HOME EXERCISE PROGRAM: Access Code: [email or web address] URL: [email or web address] Date: 08/01/2023  - Seated Assisted Cervical Rotation with Towel  - 3 x daily - 7 x weekly - 10 reps - 5 second hold - Seated Cervical Retraction  - 3-5 x daily - 7 x weekly - 2 sets - 10 reps - Seated Upper Trapezius Stretch  - 3 x daily - 7 x weekly - 3 reps - 20 second hold  ASSESSMENT:  CLINICAL IMPRESSION: Session limited due to patients late arrival. Completed manual with improvement in ROM and symptoms following. Continued with postural/periscap strengthening. Patient will continue to benefit from physical therapy in order to improve  function and reduce impairment.   OBJECTIVE IMPAIRMENTS: decreased ROM, increased fascial restrictions, impaired flexibility, impaired UE functional use, and pain.   ACTIVITY LIMITATIONS: sitting and reach over head  PARTICIPATION LIMITATIONS: driving and occupation  PERSONAL FACTORS: Time since onset of injury/illness/exacerbation are also affecting patient's functional outcome.   REHAB POTENTIAL: Good  CLINICAL DECISION MAKING: Stable/uncomplicated  EVALUATION COMPLEXITY: Low   GOALS:   SHORT TERM GOALS: Target date: 08/13/2023   Pt will be independent and compliant with HEP for improved pain, strength, and function.  Baseline:  Goal status: INITIAL  2.  Pt will report at least a 25% improvement in pain and sx's overall.  Baseline:  Goal status: INITIAL  3.  Pt will report reduced need of self-popping his neck for reduced tightness/pressure. Baseline:  Goal status: INITIAL Target date:  08/20/2023    LONG TERM GOALS: Target date: 09/13/2023  Pt's worst pain will be no > than 3/10.   Baseline:  Goal status: INITIAL  2.  Pt will report he is able to turn head while driving without limitation and without pain.  Baseline:  Goal status: INITIAL  3.  Pt will report at least a 70% improvement overall in sx's including with sitting.   Baseline:  Goal status: INITIAL  4.  Pt will report he is able to consistently reach overhead without pain.  Baseline:  Goal status: INITIAL    PLAN:  PT FREQUENCY: 1-2x/week  PT DURATION: 6 weeks  PLANNED INTERVENTIONS: Therapeutic exercises, Therapeutic activity, Neuromuscular re-education, Patient/Family education, Self Care, Joint mobilization, Joint manipulation, Stair training, Aquatic Therapy, Dry Needling, Electrical stimulation, Spinal mobilization, Cryotherapy, Moist heat, Taping, Traction, Ultrasound, Manual therapy, and Re-evaluation  PLAN FOR NEXT SESSION: STW to UT, cervical paraspinals, and suboccipitals.   Postural strengthening and stabilization.     2:33 PM, 08/08/23 Wyman Songster PT, DPT Physical Therapist at [facility]

## 2023-08-15 ENCOUNTER — Ambulatory Visit (HOSPITAL_BASED_OUTPATIENT_CLINIC_OR_DEPARTMENT_OTHER): Payer: BC Managed Care – PPO | Admitting: Physical Therapy

## 2023-08-15 DIAGNOSIS — M542 Cervicalgia: Secondary | ICD-10-CM

## 2023-08-15 DIAGNOSIS — M5030 Other cervical disc degeneration, unspecified cervical region: Secondary | ICD-10-CM | POA: Diagnosis not present

## 2023-08-15 NOTE — Therapy (Signed)
OUTPATIENT PHYSICAL THERAPY TREATMENT   Patient Name: Nicholas Alvarado MRN: [id number] DOB:04-10-1981, 42 y.o., male Today's Date: 08/16/2023  END OF SESSION:  PT End of Session - 08/15/23 1411     Visit Number 4    Number of Visits 8    Date for PT Re-Evaluation 09/03/23    Authorization Type BCBS    PT Start Time 1408    PT Stop Time 1446    PT Time Calculation (min) 38 min    Activity Tolerance Patient tolerated treatment well    Behavior During Therapy [facility] for tasks assessed/performed             History reviewed. No pertinent past medical history.  Patient Active Problem List   Diagnosis Date Noted   GAD (generalized anxiety disorder) 07/02/2018   HTN (hypertension) 07/02/2018   Sleep apnea 07/02/2018     REFERRING PROVIDER: Morrell Riddle, PA-C  REFERRING DIAG: M50.30 (ICD-10-CM) - Other cervical disc degeneration, unspecified cervical region  THERAPY DIAG:  Cervicalgia  Rationale for Evaluation and Treatment: Rehabilitation  ONSET DATE: PT order 06/18/2023  SUBJECTIVE:                                                                                                                                                                                                         SUBJECTIVE STATEMENT: Patient states he still has the tension in the same spots.  He has a new chair for work at home.  He has less fatigue since he has used the chair.  The chair has an adjustable lumbar support though it does not hold.  Pt reports no change in pain with turning head while driving.  He has been doing seated rows in the gym and has recently added lat pulldowns.  Pt reports the UT stretch helped him a lot after last Rx.  He reports having improved sx's for the rest of the day and evening.  Pt reports having increased pain with neck movement.     Hand dominance: Right  PERTINENT HISTORY:  HTN, GAD  PAIN:  NPRS:  Current 4/10, Worst 6-7/10. Best 3/10. Location: cervical/UT Pt can  occasionally have L shoulder pain.  Pt can have tingling in L UE in sitting and also playing video games.   Easing Factors:  naproxen, CBD cream, traction unit, self-popping his neck Aggravating factors:  sitting, turning neck, occasionally reaching up to grab an object  PRECAUTIONS: None     WEIGHT BEARING RESTRICTIONS: No  FALLS:  Has patient fallen in last 6 months? 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  Pt tries to get up every hour.  PLOF: Independent  PATIENT GOALS: reduce daily uncomfortable feeling.  Have tools to decrease pain.  Decrease the tension in neck/shoulders.  Reduce the need to pop his neck.   OBJECTIVE:  DIAGNOSTIC FINDINGS:   Note:  X rays in 10/23:   FINDINGS: There is straightening of the cervical spine which is similar to the prior exam. The vertebral body heights are maintained. No fracture identified. Mild disc space narrowing with anterior osteophytes at C5-C6. Mild disc space narrowing at C6-C7.  No prevertebral soft tissue swelling.   IMPRESSION: Degenerative disc disease at C5-C6 and C6-C7 which is similar to the prior exam.      TODAY'S TREATMENT:                                                                                                                              Reviewed pt presentation, pain levels, and response to prior Rx. Reviewed form with chin tucks and instructed pt in correct form. Shoulder horizontal abduction 2 x 10 with black theraband Bilateral ER with scap retraction with black theraband 2 x 10  Shoulder extension with scap retraction with BTB x 10 Cable shoulder extension with 15# x 12, 20# x 12 reps Supine manual UT stretch x 2 reps bilat  Manual Therapy: Pt received STM f/b static cupping to bilat UT and cervical paraspinals seated to improve soft tissue tightness, reduce pain, and decrease myofascial restrictions and adhesions.  PATIENT EDUCATION:  Education details: PT educated  pt with using a lumbar towel roll in chair for improved posture.  PT educated pt in using a theracane.  Dx, rationale of interventions, POC, and relevant anatomy. Person educated: Patient Education method: Medical illustrator, verbal cues Education comprehension: verbalized understanding, returned demonstration, and needs further education, verbal cues required  HOME EXERCISE PROGRAM: Access Code: [email or web address] URL: [email or web address] Date: 08/01/2023  - Seated Assisted Cervical Rotation with Towel  - 3 x daily - 7 x weekly - 10 reps - 5 second hold - Seated Cervical Retraction  - 3-5 x daily - 7 x weekly - 2 sets - 10 reps - Seated Upper Trapezius Stretch  - 3 x daily - 7 x weekly - 3 reps - 20 second hold  ASSESSMENT:  CLINICAL IMPRESSION: Pt reports no change in pain with turning head while driving.  He reports improved sx's after manual UT stretch last visit.  Pt performed exercises focusing on postural/scapular strengthening and stabilization.  He performed exercises well with cuing and instruction for correct form.  Pt continues to have tightness in bilat UT.  PT performed cupping to bilat UT and cervical paraspinals and pt had a good response with multiple petichiae.  PT educated pt in response and to drink water.  Pt responded well to Rx and reports improved tightness after Rx.  OBJECTIVE IMPAIRMENTS: decreased ROM, increased fascial restrictions, impaired flexibility, impaired UE functional use, and pain.   ACTIVITY LIMITATIONS: sitting and reach over head  PARTICIPATION LIMITATIONS: driving and occupation  PERSONAL FACTORS: Time since onset of injury/illness/exacerbation are also affecting patient's functional outcome.   REHAB POTENTIAL: Good  CLINICAL DECISION MAKING: Stable/uncomplicated  EVALUATION COMPLEXITY: Low   GOALS:   SHORT TERM GOALS: Target date: 08/13/2023   Pt will be independent and compliant with HEP for improved pain,  strength, and function.  Baseline:  Goal status: INITIAL  2.  Pt will report at least a 25% improvement in pain and sx's overall.  Baseline:  Goal status: INITIAL  3.  Pt will report reduced need of self-popping his neck for reduced tightness/pressure. Baseline:  Goal status: INITIAL Target date:  08/20/2023    LONG TERM GOALS: Target date: 09/13/2023  Pt's worst pain will be no > than 3/10.   Baseline:  Goal status: INITIAL  2.  Pt will report he is able to turn head while driving without limitation and without pain.  Baseline:  Goal status: INITIAL  3.  Pt will report at least a 70% improvement overall in sx's including with sitting.   Baseline:  Goal status: INITIAL  4.  Pt will report he is able to consistently reach overhead without pain.  Baseline:  Goal status: INITIAL    PLAN:  PT FREQUENCY: 1-2x/week  PT DURATION: 6 weeks  PLANNED INTERVENTIONS: Therapeutic exercises, Therapeutic activity, Neuromuscular re-education, Patient/Family education, Self Care, Joint mobilization, Joint manipulation, Stair training, Aquatic Therapy, Dry Needling, Electrical stimulation, Spinal mobilization, Cryotherapy, Moist heat, Taping, Traction, Ultrasound, Manual therapy, and Re-evaluation  PLAN FOR NEXT SESSION: STW to UT, cervical paraspinals, and suboccipitals.  Postural strengthening and stabilization.  Cont with cupping.   Audie Clear III PT, DPT 08/16/23 9:51 PM

## 2023-08-16 ENCOUNTER — Encounter (HOSPITAL_BASED_OUTPATIENT_CLINIC_OR_DEPARTMENT_OTHER): Payer: Self-pay | Admitting: Physical Therapy

## 2023-08-16 ENCOUNTER — Other Ambulatory Visit: Payer: Self-pay | Admitting: Medical Genetics

## 2023-08-16 DIAGNOSIS — Z006 Encounter for examination for normal comparison and control in clinical research program: Secondary | ICD-10-CM

## 2023-08-22 ENCOUNTER — Ambulatory Visit (HOSPITAL_BASED_OUTPATIENT_CLINIC_OR_DEPARTMENT_OTHER): Payer: BC Managed Care – PPO | Admitting: Physical Therapy

## 2023-08-22 DIAGNOSIS — M5030 Other cervical disc degeneration, unspecified cervical region: Secondary | ICD-10-CM | POA: Diagnosis not present

## 2023-08-22 DIAGNOSIS — M542 Cervicalgia: Secondary | ICD-10-CM

## 2023-08-23 ENCOUNTER — Encounter (HOSPITAL_BASED_OUTPATIENT_CLINIC_OR_DEPARTMENT_OTHER): Payer: Self-pay | Admitting: Physical Therapy

## 2023-08-23 NOTE — Therapy (Signed)
OUTPATIENT PHYSICAL THERAPY TREATMENT   Patient Name: Nicholas Alvarado MRN: [id number] DOB:[birth year], 42 y.o., male Today's Date: 08/23/2023  END OF SESSION:  PT End of Session - 08/23/23 0727     Visit Number 5    Number of Visits 8    Date for PT Re-Evaluation 09/03/23    Authorization Type BCBS    PT Start Time 1412    PT Stop Time 1429    PT Time Calculation (min) 17 min    Activity Tolerance Patient tolerated treatment well    Behavior During Therapy [facility] for tasks assessed/performed             History reviewed. No pertinent past medical history.  Patient Active Problem List   Diagnosis Date Noted   GAD (generalized anxiety disorder) 07/02/2018   HTN (hypertension) 07/02/2018   Sleep apnea 07/02/2018     REFERRING PROVIDER: Morrell Riddle, PA-C  REFERRING DIAG: M50.30 (ICD-10-CM) - Other cervical disc degeneration, unspecified cervical region  THERAPY DIAG:  Cervicalgia  Rationale for Evaluation and Treatment: Rehabilitation  ONSET DATE: PT order 06/18/2023  SUBJECTIVE:                                                                                                                                                                                                         SUBJECTIVE STATEMENT: Patient states he felt much better after the cupping last visit.  He states he was hurting a lot this AM.  His pain was a 6/10 this AM though now 3/10.  Pt still has the tension in the same spots in his neck.     Hand dominance: Right  PERTINENT HISTORY:  HTN, GAD  PAIN:  NPRS:  Current 3/10, Worst 6-7/10. Best 3/10. Location: cervical/UT Pt can occasionally have L shoulder pain.  Pt can have tingling in L UE in sitting and also playing video games.   Easing Factors:  naproxen, CBD cream, traction unit, self-popping his neck Aggravating factors:  sitting, turning neck, occasionally reaching up to grab an object  PRECAUTIONS: None     WEIGHT BEARING RESTRICTIONS:  No  FALLS:  Has patient fallen in last 6 months? 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  Pt tries to get up every hour.  PLOF: Independent  PATIENT GOALS: reduce daily uncomfortable feeling.  Have tools to decrease pain.  Decrease the tension in neck/shoulders.  Reduce the need to pop his neck.   OBJECTIVE:  DIAGNOSTIC FINDINGS:   Note:  X rays  in 10/23:   FINDINGS: There is straightening of the cervical spine which is similar to the prior exam. The vertebral body heights are maintained. No fracture identified. Mild disc space narrowing with anterior osteophytes at C5-C6. Mild disc space narrowing at C6-C7.  No prevertebral soft tissue swelling.   IMPRESSION: Degenerative disc disease at C5-C6 and C6-C7 which is similar to the prior exam.      TODAY'S TREATMENT:                                                                                                                               Manual Therapy: Pt received static cupping to bilat UT and cervical paraspinals seated to improve soft tissue tightness, reduce pain, and decrease myofascial restrictions and adhesions.  PATIENT EDUCATION:  Education details: PT educated pt with using a lumbar towel roll in chair for improved posture.  PT educated pt in using a theracane.  Dx, rationale of interventions, POC, and relevant anatomy. Person educated: Patient Education method: Medical illustrator, verbal cues Education comprehension: verbalized understanding, returned demonstration, and needs further education, verbal cues required  HOME EXERCISE PROGRAM: Access Code: [email or web address] URL: [email or web address] Date: 08/01/2023  - Seated Assisted Cervical Rotation with Towel  - 3 x daily - 7 x weekly - 10 reps - 5 second hold - Seated Cervical Retraction  - 3-5 x daily - 7 x weekly - 2 sets - 10 reps - Seated Upper Trapezius Stretch  - 3 x daily - 7 x weekly - 3 reps - 20 second  hold  ASSESSMENT:  CLINICAL IMPRESSION: PT brought back pt late and Pt reports he needed to leave early due to a work meeting.  Rx time was limited today.  Pt reports having a good response to prior cupping.  PT performed static cupping to bilat UT and cervical paraspinals and pt had an appropriate response.  Pt had multiple petichiae in bilat UT with cupping.  PT educated pt in response and to drink water.  Pt responded well to Rx and reports improved tightness after Rx.        OBJECTIVE IMPAIRMENTS: decreased ROM, increased fascial restrictions, impaired flexibility, impaired UE functional use, and pain.   ACTIVITY LIMITATIONS: sitting and reach over head  PARTICIPATION LIMITATIONS: driving and occupation  PERSONAL FACTORS: Time since onset of injury/illness/exacerbation are also affecting patient's functional outcome.   REHAB POTENTIAL: Good  CLINICAL DECISION MAKING: Stable/uncomplicated  EVALUATION COMPLEXITY: Low   GOALS:   SHORT TERM GOALS: Target date: 08/13/2023   Pt will be independent and compliant with HEP for improved pain, strength, and function.  Baseline:  Goal status: INITIAL  2.  Pt will report at least a 25% improvement in pain and sx's overall.  Baseline:  Goal status: INITIAL  3.  Pt will report reduced need of self-popping his neck for reduced tightness/pressure. Baseline:  Goal status: INITIAL Target date:  08/20/2023    LONG TERM GOALS: Target  date: 09/13/2023  Pt's worst pain will be no > than 3/10.   Baseline:  Goal status: INITIAL  2.  Pt will report he is able to turn head while driving without limitation and without pain.  Baseline:  Goal status: INITIAL  3.  Pt will report at least a 70% improvement overall in sx's including with sitting.   Baseline:  Goal status: INITIAL  4.  Pt will report he is able to consistently reach overhead without pain.  Baseline:  Goal status: INITIAL    PLAN:  PT FREQUENCY: 1-2x/week  PT  DURATION: 6 weeks  PLANNED INTERVENTIONS: Therapeutic exercises, Therapeutic activity, Neuromuscular re-education, Patient/Family education, Self Care, Joint mobilization, Joint manipulation, Stair training, Aquatic Therapy, Dry Needling, Electrical stimulation, Spinal mobilization, Cryotherapy, Moist heat, Taping, Traction, Ultrasound, Manual therapy, and Re-evaluation  PLAN FOR NEXT SESSION: STW to UT, cervical paraspinals, and suboccipitals.  Postural strengthening and stabilization.  Cont with cupping.   Audie Clear III PT, DPT 08/23/23 7:37 AM

## 2023-08-29 ENCOUNTER — Encounter (HOSPITAL_BASED_OUTPATIENT_CLINIC_OR_DEPARTMENT_OTHER): Payer: Self-pay | Admitting: Physical Therapy

## 2023-08-29 ENCOUNTER — Other Ambulatory Visit (HOSPITAL_COMMUNITY)
Admission: RE | Admit: 2023-08-29 | Discharge: 2023-08-29 | Disposition: A | Discharge status: Home / Self Care | Payer: BC Managed Care – PPO | Source: Ambulatory Visit | Attending: Oncology | Admitting: Oncology

## 2023-08-29 ENCOUNTER — Ambulatory Visit (HOSPITAL_BASED_OUTPATIENT_CLINIC_OR_DEPARTMENT_OTHER): Payer: BC Managed Care – PPO | Attending: Physician Assistant | Admitting: Physical Therapy

## 2023-08-29 DIAGNOSIS — Z006 Encounter for examination for normal comparison and control in clinical research program: Secondary | ICD-10-CM | POA: Insufficient documentation

## 2023-08-29 DIAGNOSIS — M542 Cervicalgia: Secondary | ICD-10-CM | POA: Diagnosis present

## 2023-08-29 NOTE — Therapy (Signed)
OUTPATIENT PHYSICAL THERAPY TREATMENT   Patient Name: Nicholas Alvarado MRN: [id number] DOB:1980-10-30, 42 y.o., male Today's Date: 08/29/2023  END OF SESSION:  PT End of Session - 08/29/23 1515     Visit Number 6    Number of Visits 8    Date for PT Re-Evaluation 09/03/23    Authorization Type BCBS    PT Start Time 1515    PT Stop Time 1550    PT Time Calculation (min) 35 min    Activity Tolerance Patient tolerated treatment well    Behavior During Therapy [facility] for tasks assessed/performed             History reviewed. No pertinent past medical history.  Patient Active Problem List   Diagnosis Date Noted   GAD (generalized anxiety disorder) 07/02/2018   HTN (hypertension) 07/02/2018   Sleep apnea 07/02/2018     REFERRING PROVIDER: Morrell Riddle, PA-C  REFERRING DIAG: M50.30 (ICD-10-CM) - Other cervical disc degeneration, unspecified cervical region  THERAPY DIAG:  Cervicalgia  Rationale for Evaluation and Treatment: Rehabilitation  ONSET DATE: PT order 06/18/2023  SUBJECTIVE:                                                                                                                                                                                                         SUBJECTIVE STATEMENT: Patient states cupping has been good. Has really helped with the traps. Most symptoms on the sides of his neck. Best his neck has felt in a while. Been doing back exercises.   Hand dominance: Right  PERTINENT HISTORY:  HTN, GAD  PAIN:  NPRS:  Current 1/10, Worst 6-7/10. Best 3/10. Location: cervical/UT Pt can occasionally have L shoulder pain.  Pt can have tingling in L UE in sitting and also playing video games.   Easing Factors:  naproxen, CBD cream, traction unit, self-popping his neck Aggravating factors:  sitting, turning neck, occasionally reaching up to grab an object  PRECAUTIONS: None     WEIGHT BEARING RESTRICTIONS: No  FALLS:  Has patient fallen in  last 6 months? 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  Pt tries to get up every hour.  PLOF: Independent  PATIENT GOALS: reduce daily uncomfortable feeling.  Have tools to decrease pain.  Decrease the tension in neck/shoulders.  Reduce the need to pop his neck.   OBJECTIVE:  DIAGNOSTIC FINDINGS:   Note:  X rays in 10/23:   FINDINGS: There is straightening of the cervical spine which is similar  to the prior exam. The vertebral body heights are maintained. No fracture identified. Mild disc space narrowing with anterior osteophytes at C5-C6. Mild disc space narrowing at C6-C7.  No prevertebral soft tissue swelling.   IMPRESSION: Degenerative disc disease at C5-C6 and C6-C7 which is similar to the prior exam.      TODAY'S TREATMENT:                                                                                                                              08/29/23 Manual: STM to cervical paraspinals  Manual: STM to cervical paraspinals pre and post dry needling for trigger point identification and muscular relaxation. Trigger Point Dry-Needling  Treatment instructions: Expect mild to moderate muscle soreness. S/S of pneumothorax if dry needled over a lung field, and to seek immediate medical attention should they occur. Patient verbalized understanding of these instructions and education.  Patient Consent Given: Yes Education handout provided: Previously provided Muscles treated: cervical paraspinals Electrical stimulation performed: No Parameters: N/A Treatment response/outcome: twitch response, decrease in tissue tension  Seated cervical retractions 2 x 10  08/22/23 Manual Therapy: Pt received static cupping to bilat UT and cervical paraspinals seated to improve soft tissue tightness, reduce pain, and decrease myofascial restrictions and adhesions.  PATIENT EDUCATION:  Education details: PT educated pt with using a lumbar towel roll in chair  for improved posture.  PT educated pt in using a theracane.  Dx, rationale of interventions, POC, and relevant anatomy. Person educated: Patient Education method: Medical illustrator, verbal cues Education comprehension: verbalized understanding, returned demonstration, and needs further education, verbal cues required  HOME EXERCISE PROGRAM: Access Code: [email or web address] URL: [email or web address] Date: 08/01/2023  - Seated Assisted Cervical Rotation with Towel  - 3 x daily - 7 x weekly - 10 reps - 5 second hold - Seated Cervical Retraction  - 3-5 x daily - 7 x weekly - 2 sets - 10 reps - Seated Upper Trapezius Stretch  - 3 x daily - 7 x weekly - 3 reps - 20 second hold  ASSESSMENT:  CLINICAL IMPRESSION:      Patient with hyperactive and tender cervical musculature. Decrease in tension with manual therapy and DN with twitch response. Improvement in symptoms to 0/10 following and improved cervical mobility. Patient will continue to benefit from physical therapy in order to improve function and reduce impairment.   OBJECTIVE IMPAIRMENTS: decreased ROM, increased fascial restrictions, impaired flexibility, impaired UE functional use, and pain.   ACTIVITY LIMITATIONS: sitting and reach over head  PARTICIPATION LIMITATIONS: driving and occupation  PERSONAL FACTORS: Time since onset of injury/illness/exacerbation are also affecting patient's functional outcome.   REHAB POTENTIAL: Good  CLINICAL DECISION MAKING: Stable/uncomplicated  EVALUATION COMPLEXITY: Low   GOALS:   SHORT TERM GOALS: Target date: 08/13/2023   Pt will be independent and compliant with HEP for improved pain, strength, and function.  Baseline:  Goal status: INITIAL  2.  Pt will report at least  a 25% improvement in pain and sx's overall.  Baseline:  Goal status: INITIAL  3.  Pt will report reduced need of self-popping his neck for reduced tightness/pressure. Baseline:  Goal status:  INITIAL Target date:  08/20/2023    LONG TERM GOALS: Target date: 09/13/2023  Pt's worst pain will be no > than 3/10.   Baseline:  Goal status: INITIAL  2.  Pt will report he is able to turn head while driving without limitation and without pain.  Baseline:  Goal status: INITIAL  3.  Pt will report at least a 70% improvement overall in sx's including with sitting.   Baseline:  Goal status: INITIAL  4.  Pt will report he is able to consistently reach overhead without pain.  Baseline:  Goal status: INITIAL    PLAN:  PT FREQUENCY: 1-2x/week  PT DURATION: 6 weeks  PLANNED INTERVENTIONS: Therapeutic exercises, Therapeutic activity, Neuromuscular re-education, Patient/Family education, Self Care, Joint mobilization, Joint manipulation, Stair training, Aquatic Therapy, Dry Needling, Electrical stimulation, Spinal mobilization, Cryotherapy, Moist heat, Taping, Traction, Ultrasound, Manual therapy, and Re-evaluation  PLAN FOR NEXT SESSION: STW to UT, cervical paraspinals, and suboccipitals.  Postural strengthening and stabilization.  Cont with cupping.    Reola Mosher Ranay Ketter, PT 08/29/2023, 3:59 PM

## 2023-09-05 ENCOUNTER — Ambulatory Visit (HOSPITAL_BASED_OUTPATIENT_CLINIC_OR_DEPARTMENT_OTHER): Payer: BC Managed Care – PPO | Admitting: Physical Therapy

## 2023-09-05 DIAGNOSIS — M542 Cervicalgia: Secondary | ICD-10-CM | POA: Diagnosis not present

## 2023-09-05 NOTE — Therapy (Signed)
OUTPATIENT PHYSICAL THERAPY TREATMENT   Patient Name: Nicholas Alvarado MRN: [id number] DOB:Jun 11, 1981, 42 y.o., male Today's Date: 09/06/2023  END OF SESSION:  PT End of Session - 09/05/23 1500     Visit Number 7    Number of Visits 13    Date for PT Re-Evaluation 10/17/23    Authorization Type BCBS    PT Start Time 1409    PT Stop Time 1451    PT Time Calculation (min) 42 min    Activity Tolerance Patient tolerated treatment well    Behavior During Therapy [facility] for tasks assessed/performed              History reviewed. No pertinent past medical history.  Patient Active Problem List   Diagnosis Date Noted   GAD (generalized anxiety disorder) 07/02/2018   HTN (hypertension) 07/02/2018   Sleep apnea 07/02/2018     REFERRING PROVIDER: Morrell Riddle, PA-C  REFERRING DIAG: M50.30 (ICD-10-CM) - Other cervical disc degeneration, unspecified cervical region  THERAPY DIAG:  Cervicalgia  Rationale for Evaluation and Treatment: Rehabilitation  ONSET DATE: PT order 06/18/2023  SUBJECTIVE:                                                                                                                                                                                                         SUBJECTIVE STATEMENT: Patient felt good after prior Rx.  He feels better after each PT visit though relief is temporary, lasting about a day.  He thinks the strengthening exercises have helped.  "What we did last week really seem to help."  Pt states the DN helped.  He reports his UT's are better though still having pain on both sides of his neck.   Pt reports no change in turning head while driving.  Pt reports a 50% improvement overall in pain and sx's overall. Pt states he has not been compliant with HEP.     Hand dominance: Right  PERTINENT HISTORY:  HTN, GAD  PAIN:  NPRS:  Current 4/10, Worst 6/10. Best 0/10. Location: cervical/UT Pt can occasionally have L shoulder pain.  Pt can have  tingling in L UE in sitting and also playing video games.   Easing Factors:  naproxen, CBD cream, traction unit, self-popping his neck Aggravating factors:  sitting, turning neck, occasionally reaching up to grab an object  PRECAUTIONS: None     WEIGHT BEARING RESTRICTIONS: No  FALLS:  Has patient fallen in last 6 months? 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  Pt tries to get up every hour.  PLOF: Independent  PATIENT GOALS: reduce daily uncomfortable feeling.  Have tools to decrease pain.  Decrease the tension in neck/shoulders.  Reduce the need to pop his neck.   OBJECTIVE:  DIAGNOSTIC FINDINGS:   Note:  X rays in 10/23:   FINDINGS: There is straightening of the cervical spine which is similar to the prior exam. The vertebral body heights are maintained. No fracture identified. Mild disc space narrowing with anterior osteophytes at C5-C6. Mild disc space narrowing at C6-C7.  No prevertebral soft tissue swelling.   IMPRESSION: Degenerative disc disease at C5-C6 and C6-C7 which is similar to the prior exam.      TODAY'S TREATMENT:                                                                                                                               FOTO:  Initial/Current:  62 / 71.  Goal of 70 at visit 11.  Reviewed pain levels, HEP compliance, response to prior Rx, and current function.   Manual Therapy: Pt received unilateral PA glides and sideglides to bilat lower cervical in supine static and dynamic cupping to bilat UT and cervical paraspinals seated to improve soft tissue tightness, reduce pain, and decrease myofascial restrictions and adhesions.  PATIENT EDUCATION:  Education details: PT encouraged pt to be compliant with HEP.  Goal progress, rationale of interventions, POC, and relevant anatomy. Person educated: Patient Education method: Medical illustrator, verbal cues Education comprehension: verbalized  understanding, returned demonstration, and needs further education, verbal cues required  HOME EXERCISE PROGRAM: Access Code: [email or web address] URL: [email or web address] Date: 08/01/2023  - Seated Assisted Cervical Rotation with Towel  - 3 x daily - 7 x weekly - 10 reps - 5 second hold - Seated Cervical Retraction  - 3-5 x daily - 7 x weekly - 2 sets - 10 reps - Seated Upper Trapezius Stretch  - 3 x daily - 7 x weekly - 3 reps - 20 second hold  ASSESSMENT:  CLINICAL IMPRESSION:      Patient is improving with pain and sx's as evidenced by subjective reports.  He reports a 50% improvement overall in pain and sx's overall.  He responds well to manual techniques though only reports short term pain relief.  Pt reports no change in turning head while driving.  Pt has not been compliant with HEP though has been going to the gym regularly.  Pt demonstrates improved self perceived disability with FOTO score improving from initially 62 to currently 71.  He met his FOTO goal.  Pt has met STG #2 and partially met STG #3 and LTG #3.  Pt states he has a little reduced stiffness after jt mobs.  Pt had an appropriate response to cupping having multiple petichiae.  PT showed pt his response in mirror and instructed pt in drinking plenty of water.  He responded well to Rx stating he feels  better after Rx having reduced stiffness.  He should benefit from cont skilled PT services to address ongoing goals and to improve overall function.    OBJECTIVE IMPAIRMENTS: decreased ROM, increased fascial restrictions, impaired flexibility, impaired UE functional use, and pain.   ACTIVITY LIMITATIONS: sitting and reach over head  PARTICIPATION LIMITATIONS: driving and occupation  PERSONAL FACTORS: Time since onset of injury/illness/exacerbation are also affecting patient's functional outcome.   REHAB POTENTIAL: Good  CLINICAL DECISION MAKING: Stable/uncomplicated  EVALUATION COMPLEXITY:  Low   GOALS:   SHORT TERM GOALS: Target date: 08/13/2023   Pt will be independent and compliant with HEP for improved pain, strength, and function.  Baseline:  Goal status: PROGRESSING  11/14  2.  Pt will report at least a 25% improvement in pain and sx's overall.  Baseline:  Goal status: GOAL MET  11/14  3.  Pt will report reduced need of self-popping his neck for reduced tightness/pressure. Baseline:  Goal status:  PARTIALLY MET  11/14 Target date:  08/20/2023    LONG TERM GOALS: Target date: 10/17/2023  Pt's worst pain will be no > than 3/10.   Baseline:  Goal status: NOT MET   11/14  2.  Pt will report he is able to turn head while driving without limitation and without pain.  Baseline:  Goal status:  NOT MET  11/14  3.  Pt will report at least a 70% improvement overall in sx's including with sitting.   Baseline:  Goal status:  PARTIALLY MET  11/14  4.  Pt will report he is able to consistently reach overhead without pain.  Baseline:  Goal status: INITIAL    PLAN:  PT FREQUENCY: 1-2x/week  PT DURATION: 6 weeks  PLANNED INTERVENTIONS: Therapeutic exercises, Therapeutic activity, Neuromuscular re-education, Patient/Family education, Self Care, Joint mobilization, Joint manipulation, Stair training, Aquatic Therapy, Dry Needling, Electrical stimulation, Spinal mobilization, Cryotherapy, Moist heat, Taping, Traction, Ultrasound, Manual therapy, and Re-evaluation  PLAN FOR NEXT SESSION: STW to UT, cervical paraspinals, and suboccipitals.  Postural strengthening and stabilization.  Cont with cupping and DN.    Audie Clear III PT, DPT 09/06/23 11:56 PM

## 2023-09-06 ENCOUNTER — Encounter (HOSPITAL_BASED_OUTPATIENT_CLINIC_OR_DEPARTMENT_OTHER): Payer: Self-pay | Admitting: Physical Therapy

## 2023-09-10 LAB — HELIX MOLECULAR SCREEN: Genetic Analysis Overall Interpretation: NEGATIVE

## 2023-09-10 LAB — GENECONNECT MOLECULAR SCREEN

## 2023-09-12 ENCOUNTER — Ambulatory Visit (HOSPITAL_BASED_OUTPATIENT_CLINIC_OR_DEPARTMENT_OTHER): Payer: BC Managed Care – PPO | Admitting: Physical Therapy

## 2023-09-12 DIAGNOSIS — M542 Cervicalgia: Secondary | ICD-10-CM

## 2023-09-12 NOTE — Therapy (Signed)
OUTPATIENT PHYSICAL THERAPY TREATMENT   Patient Name: Nicholas Alvarado MRN: [id number] DOB:12/28/1980, 42 y.o., male Today's Date: 09/13/2023  END OF SESSION:  PT End of Session - 09/12/23 1608     Visit Number 8    Number of Visits 13    Date for PT Re-Evaluation 10/17/23    Authorization Type BCBS    PT Start Time 1546    PT Stop Time 1630    PT Time Calculation (min) 44 min    Activity Tolerance Patient tolerated treatment well    Behavior During Therapy [facility] for tasks assessed/performed               History reviewed. No pertinent past medical history.  Patient Active Problem List   Diagnosis Date Noted   GAD (generalized anxiety disorder) 07/02/2018   HTN (hypertension) 07/02/2018   Sleep apnea 07/02/2018     REFERRING PROVIDER: Morrell Riddle, PA-C  REFERRING DIAG: M50.30 (ICD-10-CM) - Other cervical disc degeneration, unspecified cervical region  THERAPY DIAG:  Cervicalgia  Rationale for Evaluation and Treatment: Rehabilitation  ONSET DATE: PT order 06/18/2023  SUBJECTIVE:                                                                                                                                                                                                         SUBJECTIVE STATEMENT: Pt states he felt good after prior Rx.  Patient states he contacted MD about an ESI.  Pt states he he has been having pain in upper cervical/base of his skull.  He states he feels better after each PT visit though relief is temporary.     Hand dominance: Right  PERTINENT HISTORY:  HTN, GAD  PAIN:  NPRS:  Current "annoying" 4/10, Worst 6/10. Best 0/10. Location: cervical/UT Pt can occasionally have L shoulder pain.  Pt can have tingling in L UE in sitting and also playing video games.   Easing Factors:  naproxen, CBD cream, traction unit, self-popping his neck Aggravating factors:  sitting, turning neck, occasionally reaching up to grab an object  PRECAUTIONS:  None     WEIGHT BEARING RESTRICTIONS: No  FALLS:  Has patient fallen in last 6 months? 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  Pt tries to get up every hour.  PLOF: Independent  PATIENT GOALS: reduce daily uncomfortable feeling.  Have tools to decrease pain.  Decrease the tension in neck/shoulders.  Reduce the need to pop his neck.   OBJECTIVE:  DIAGNOSTIC FINDINGS:  Note:  X rays in 10/23:   FINDINGS: There is straightening of the cervical spine which is similar to the prior exam. The vertebral body heights are maintained. No fracture identified. Mild disc space narrowing with anterior osteophytes at C5-C6. Mild disc space narrowing at C6-C7.  No prevertebral soft tissue swelling.   IMPRESSION: Degenerative disc disease at C5-C6 and C6-C7 which is similar to the prior exam.      TODAY'S TREATMENT:                                                                                                                               UBE x 4 mins lvl1 Cable rows 15# approx 20 reps, 20# 2x10 Cable shoulder extension 20# 3x10 reps Cybex lat pulldowns 110# 3x10    Manual Therapy: -static and dynamic cupping to bilat UT and cervical paraspinals seated to improve soft tissue tightness, reduce pain, and decrease myofascial restrictions and adhesions. -Cervical manual distraction x 4 reps  PATIENT EDUCATION:  Education details: PT encouraged pt to be compliant with HEP.  Goal progress, rationale of interventions, POC, and relevant anatomy. Person educated: Patient Education method: Medical illustrator, verbal cues Education comprehension: verbalized understanding, returned demonstration, and needs further education, verbal cues required  HOME EXERCISE PROGRAM: Access Code: [email or web address] URL: [email or web address] Date: 08/01/2023  - Seated Assisted Cervical Rotation with Towel  - 3 x daily - 7 x weekly - 10 reps - 5 second  hold - Seated Cervical Retraction  - 3-5 x daily - 7 x weekly - 2 sets - 10 reps - Seated Upper Trapezius Stretch  - 3 x daily - 7 x weekly - 3 reps - 20 second hold  ASSESSMENT:  CLINICAL IMPRESSION:  Patient performed gym exercises well with cuing for set up of cable exercises.  Pt continues to have soft tissue tightness in bilat UT > cervical paraspinals as evidenced by cupping response.  He had multiple petichiae on bilat sides with cupping.  PT showed pt his response in mirror and instructed pt in drinking plenty of water.  He responded well to Rx stating he feels better having no pain Rx.  He should benefit from cont skilled PT services to address ongoing goals and to improve overall function.    OBJECTIVE IMPAIRMENTS: decreased ROM, increased fascial restrictions, impaired flexibility, impaired UE functional use, and pain.   ACTIVITY LIMITATIONS: sitting and reach over head  PARTICIPATION LIMITATIONS: driving and occupation  PERSONAL FACTORS: Time since onset of injury/illness/exacerbation are also affecting patient's functional outcome.   REHAB POTENTIAL: Good  CLINICAL DECISION MAKING: Stable/uncomplicated  EVALUATION COMPLEXITY: Low   GOALS:   SHORT TERM GOALS: Target date: 08/13/2023   Pt will be independent and compliant with HEP for improved pain, strength, and function.  Baseline:  Goal status: PROGRESSING  11/14  2.  Pt will report at least a 25% improvement in pain and sx's overall.  Baseline:  Goal status: GOAL MET  11/14  3.  Pt will report reduced need of self-popping his neck for reduced tightness/pressure. Baseline:  Goal status:  PARTIALLY MET  11/14 Target date:  08/20/2023    LONG TERM GOALS: Target date: 10/17/2023  Pt's worst pain will be no > than 3/10.   Baseline:  Goal status: NOT MET   11/14  2.  Pt will report he is able to turn head while driving without limitation and without pain.  Baseline:  Goal status:  NOT MET  11/14  3.   Pt will report at least a 70% improvement overall in sx's including with sitting.   Baseline:  Goal status:  PARTIALLY MET  11/14  4.  Pt will report he is able to consistently reach overhead without pain.  Baseline:  Goal status: INITIAL    PLAN:  PT FREQUENCY: 1-2x/week  PT DURATION: 6 weeks  PLANNED INTERVENTIONS: Therapeutic exercises, Therapeutic activity, Neuromuscular re-education, Patient/Family education, Self Care, Joint mobilization, Joint manipulation, Stair training, Aquatic Therapy, Dry Needling, Electrical stimulation, Spinal mobilization, Cryotherapy, Moist heat, Taping, Traction, Ultrasound, Manual therapy, and Re-evaluation  PLAN FOR NEXT SESSION: STW to UT, cervical paraspinals, and suboccipitals.  Postural strengthening and stabilization.  Cont with cupping and DN.    Audie Clear III PT, DPT 09/13/23 2:20 PM

## 2023-09-13 ENCOUNTER — Encounter (HOSPITAL_BASED_OUTPATIENT_CLINIC_OR_DEPARTMENT_OTHER): Payer: Self-pay | Admitting: Physical Therapy

## 2023-09-18 ENCOUNTER — Ambulatory Visit (HOSPITAL_BASED_OUTPATIENT_CLINIC_OR_DEPARTMENT_OTHER): Payer: BC Managed Care – PPO | Admitting: Physical Therapy

## 2023-09-28 ENCOUNTER — Encounter (HOSPITAL_BASED_OUTPATIENT_CLINIC_OR_DEPARTMENT_OTHER): Payer: BC Managed Care – PPO | Admitting: Physical Therapy

## 2023-10-02 ENCOUNTER — Ambulatory Visit (HOSPITAL_BASED_OUTPATIENT_CLINIC_OR_DEPARTMENT_OTHER): Payer: BC Managed Care – PPO | Attending: Physician Assistant | Admitting: Physical Therapy

## 2023-10-02 DIAGNOSIS — M542 Cervicalgia: Secondary | ICD-10-CM | POA: Insufficient documentation

## 2023-10-02 NOTE — Therapy (Signed)
OUTPATIENT PHYSICAL THERAPY TREATMENT   Patient Name: Nicholas Alvarado MRN: [id number] DOB:1980/12/06, 42 y.o., male Today's Date: 10/03/2023  END OF SESSION:  PT End of Session - 10/02/23 1503     Visit Number 9    Number of Visits 13    Date for PT Re-Evaluation 10/17/23    Authorization Type BCBS    PT Start Time 1450    PT Stop Time 1529    PT Time Calculation (min) 39 min    Activity Tolerance Patient tolerated treatment well    Behavior During Therapy [facility] for tasks assessed/performed                History reviewed. No pertinent past medical history.  Patient Active Problem List   Diagnosis Date Noted   GAD (generalized anxiety disorder) 07/02/2018   HTN (hypertension) 07/02/2018   Sleep apnea 07/02/2018     REFERRING PROVIDER: Morrell Riddle, PA-C  REFERRING DIAG: M50.30 (ICD-10-CM) - Other cervical disc degeneration, unspecified cervical region  THERAPY DIAG:  Cervicalgia  Rationale for Evaluation and Treatment: Rehabilitation  ONSET DATE: PT order 06/18/2023  SUBJECTIVE:                                                                                                                                                                                                         SUBJECTIVE STATEMENT: Pt denies any adverse effects after prior Rx.  Pt has been using his [employer] home traction unit which helps.  Pt states he felt good after prior Rx.  Pt has been having H/A's and has been taking more Ibuprofen.  He states he feels better after each PT visit though relief is temporary.  Pt is having a MRI on 12/19.  Pt performed gym exercises yesterday.     Hand dominance: Right  PERTINENT HISTORY:  HTN, GAD  PAIN:  NPRS:  2-3/10 current, 5-6/10 at times with movement, Worst 6/10. Best 0/10. Location: cervical/UT Pt can occasionally have L shoulder pain.  Pt can have tingling in L UE in sitting and also playing video games.   Easing Factors:  naproxen, CBD cream,  traction unit, self-popping his neck Aggravating factors:  sitting, turning neck, occasionally reaching up to grab an object  PRECAUTIONS: None     WEIGHT BEARING RESTRICTIONS: No  FALLS:  Has patient fallen in last 6 months? 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  Pt tries to get up every hour.  PLOF: Independent  PATIENT GOALS: reduce daily uncomfortable feeling.  Have tools to decrease pain.  Decrease the tension in neck/shoulders.  Reduce the need to pop his neck.   OBJECTIVE:  DIAGNOSTIC FINDINGS:   Note:  X rays in 10/23:   FINDINGS: There is straightening of the cervical spine which is similar to the prior exam. The vertebral body heights are maintained. No fracture identified. Mild disc space narrowing with anterior osteophytes at C5-C6. Mild disc space narrowing at C6-C7.  No prevertebral soft tissue swelling.   IMPRESSION: Degenerative disc disease at C5-C6 and C6-C7 which is similar to the prior exam.      TODAY'S TREATMENT:                                                                                                                               UBE x 4 mins lvl 1 (2 mins each fwd and bwd) Shoulder horizontal abduction 2 x 10 with black theraband Bilateral ER with scap retraction with black theraband 2 x 10  4D ball rolls on wall 2x10 bilat   Manual Therapy: -Reviewed pain and sx's, response to prior Rx, and home management strategies.  -static cupping to bilat UT and cervical paraspinals seated to improve soft tissue tightness, reduce pain, and decrease myofascial restrictions and adhesions. -Cervical manual distraction in supine with bilat LE's elevated 3x1 min  PATIENT EDUCATION:  Education details: PT encouraged pt to be compliant with HEP.  Goal progress, rationale of interventions, POC, and relevant anatomy. Person educated: Patient Education method: Medical illustrator, verbal cues Education  comprehension: verbalized understanding, returned demonstration, and needs further education, verbal cues required  HOME EXERCISE PROGRAM: Access Code: [email or web address] URL: [email or web address] Date: 08/01/2023  - Seated Assisted Cervical Rotation with Towel  - 3 x daily - 7 x weekly - 10 reps - 5 second hold - Seated Cervical Retraction  - 3-5 x daily - 7 x weekly - 2 sets - 10 reps - Seated Upper Trapezius Stretch  - 3 x daily - 7 x weekly - 3 reps - 20 second hold  ASSESSMENT:  CLINICAL IMPRESSION:  Pt has continued sx's and reports he has been taking more Ibuprofen due to H/A's.  He feels better after PT treatments though relief is temporary.  Pt is having a MRI this month.  Pt performed exercises well and was fatigued with 4D ball rolls on wall.  Pt continues to have significant soft tissue tightness in UT as evidenced by response to cupping.  Pt had multiple petichiae on bilat sides with cupping though had a greater response on L UT than R.  PT showed pt his response in mirror and instructed pt in drinking plenty of water.  He responded well to Rx stating he feels better having reduced tightness after Rx.     OBJECTIVE IMPAIRMENTS: decreased ROM, increased fascial restrictions, impaired flexibility, impaired UE functional use, and pain.   ACTIVITY LIMITATIONS: sitting and reach over head  PARTICIPATION LIMITATIONS: driving and occupation  PERSONAL FACTORS: Time since onset of injury/illness/exacerbation are also affecting patient's functional outcome.   REHAB POTENTIAL: Good  CLINICAL DECISION MAKING: Stable/uncomplicated  EVALUATION COMPLEXITY: Low   GOALS:   SHORT TERM GOALS: Target date: 08/13/2023   Pt will be independent and compliant with HEP for improved pain, strength, and function.  Baseline:  Goal status: PROGRESSING  11/14  2.  Pt will report at least a 25% improvement in pain and sx's overall.  Baseline:  Goal status: GOAL MET  11/14  3.  Pt  will report reduced need of self-popping his neck for reduced tightness/pressure. Baseline:  Goal status:  PARTIALLY MET  11/14 Target date:  08/20/2023    LONG TERM GOALS: Target date: 10/17/2023  Pt's worst pain will be no > than 3/10.   Baseline:  Goal status: NOT MET   11/14  2.  Pt will report he is able to turn head while driving without limitation and without pain.  Baseline:  Goal status:  NOT MET  11/14  3.  Pt will report at least a 70% improvement overall in sx's including with sitting.   Baseline:  Goal status:  PARTIALLY MET  11/14  4.  Pt will report he is able to consistently reach overhead without pain.  Baseline:  Goal status: INITIAL    PLAN:  PT FREQUENCY: 1-2x/week  PT DURATION: 6 weeks  PLANNED INTERVENTIONS: Therapeutic exercises, Therapeutic activity, Neuromuscular re-education, Patient/Family education, Self Care, Joint mobilization, Joint manipulation, Stair training, Aquatic Therapy, Dry Needling, Electrical stimulation, Spinal mobilization, Cryotherapy, Moist heat, Taping, Traction, Ultrasound, Manual therapy, and Re-evaluation  PLAN FOR NEXT SESSION: STW to UT, cervical paraspinals, and suboccipitals.  Postural strengthening and stabilization.  Cont with cupping and DN.  Pt is having a MRI on 12/19.    Audie Clear III PT, DPT 10/03/23 9:55 PM

## 2023-10-03 ENCOUNTER — Encounter (HOSPITAL_BASED_OUTPATIENT_CLINIC_OR_DEPARTMENT_OTHER): Payer: Self-pay | Admitting: Physical Therapy

## 2023-10-10 ENCOUNTER — Ambulatory Visit (HOSPITAL_BASED_OUTPATIENT_CLINIC_OR_DEPARTMENT_OTHER): Payer: BC Managed Care – PPO | Admitting: Physical Therapy

## 2023-10-10 ENCOUNTER — Encounter (HOSPITAL_BASED_OUTPATIENT_CLINIC_OR_DEPARTMENT_OTHER): Payer: Self-pay | Admitting: Physical Therapy

## 2023-10-10 DIAGNOSIS — M542 Cervicalgia: Secondary | ICD-10-CM

## 2023-10-10 NOTE — Therapy (Signed)
OUTPATIENT PHYSICAL THERAPY TREATMENT   Patient Name: Nicholas Alvarado MRN: [id number] DOB:1980-11-20, 42 y.o., male Today's Date: 10/10/2023  END OF SESSION:  PT End of Session - 10/10/23 1430     Visit Number 10    Number of Visits 13    Date for PT Re-Evaluation 10/17/23    Authorization Type BCBS    PT Start Time 1430    PT Stop Time 1510    PT Time Calculation (min) 40 min    Activity Tolerance Patient tolerated treatment well    Behavior During Therapy [facility] for tasks assessed/performed                History reviewed. No pertinent past medical history.  Patient Active Problem List   Diagnosis Date Noted   GAD (generalized anxiety disorder) 07/02/2018   HTN (hypertension) 07/02/2018   Sleep apnea 07/02/2018     REFERRING PROVIDER: Morrell Riddle, PA-C  REFERRING DIAG: M50.30 (ICD-10-CM) - Other cervical disc degeneration, unspecified cervical region  THERAPY DIAG:  Cervicalgia  Rationale for Evaluation and Treatment: Rehabilitation  ONSET DATE: PT order 06/18/2023  SUBJECTIVE:                                                                                                                                                                                                         SUBJECTIVE STATEMENT: Pt states having MRI today. Still traps are bothering him.     Hand dominance: Right  PERTINENT HISTORY:  HTN, GAD  PAIN:  NPRS:  2-3/10 current, 5-6/10 at times with movement, Worst 6/10. Best 0/10. Location: cervical/UT Pt can occasionally have L shoulder pain.  Pt can have tingling in L UE in sitting and also playing video games.   Easing Factors:  naproxen, CBD cream, traction unit, self-popping his neck Aggravating factors:  sitting, turning neck, occasionally reaching up to grab an object  PRECAUTIONS: None     WEIGHT BEARING RESTRICTIONS: No  FALLS:  Has patient fallen in last 6 months? 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  Pt tries to get up every hour.  PLOF: Independent  PATIENT GOALS: reduce daily uncomfortable feeling.  Have tools to decrease pain.  Decrease the tension in neck/shoulders.  Reduce the need to pop his neck.   OBJECTIVE:  DIAGNOSTIC FINDINGS:   Note:  X rays in 10/23:   FINDINGS: There is straightening of the cervical spine which is similar to the prior exam. The vertebral body heights are maintained. No fracture  identified. Mild disc space narrowing with anterior osteophytes at C5-C6. Mild disc space narrowing at C6-C7.  No prevertebral soft tissue swelling.   IMPRESSION: Degenerative disc disease at C5-C6 and C6-C7 which is similar to the prior exam.      TODAY'S TREATMENT:                                                                                                                              10/10/23 UBE x 4 mins lvl 1 (2 mins each fwd and bwd) Manual: STM to suboccipitals/paraspinals, R and L UPA C2-C6, R and L UPA upper t/sp and ribs Cervical retraction 1 x 10 Cervical retraction extension 2 x 10 Cervical retraction extension rotation 2 x 10   10/02/23 UBE x 4 mins lvl 1 (2 mins each fwd and bwd) Shoulder horizontal abduction 2 x 10 with black theraband Bilateral ER with scap retraction with black theraband 2 x 10  4D ball rolls on wall 2x10 bilat   Manual Therapy: -Reviewed pain and sx's, response to prior Rx, and home management strategies.  -static cupping to bilat UT and cervical paraspinals seated to improve soft tissue tightness, reduce pain, and decrease myofascial restrictions and adhesions. -Cervical manual distraction in supine with bilat LE's elevated 3x1 min  PATIENT EDUCATION:  Education details: PT encouraged pt to be compliant with HEP.  Goal progress, rationale of interventions, POC, and relevant anatomy. Person educated: Patient Education method: Medical illustrator, verbal cues Education comprehension: verbalized  understanding, returned demonstration, and needs further education, verbal cues required  HOME EXERCISE PROGRAM: Access Code: [email or web address] URL: [email or web address] Date: 08/01/2023  - Seated Assisted Cervical Rotation with Towel  - 3 x daily - 7 x weekly - 10 reps - 5 second hold - Seated Cervical Retraction  - 3-5 x daily - 7 x weekly - 2 sets - 10 reps - Seated Upper Trapezius Stretch  - 3 x daily - 7 x weekly - 3 reps - 20 second hold  ASSESSMENT:  CLINICAL IMPRESSION: Patient with good cervical ROM but continued cervical symptoms. Continued with manual to cervical spine, upper thoracic and ribs with improvement following. Good mechanics and good results with retraction progression. Patient will continue to benefit from physical therapy in order to improve function and reduce impairment.    OBJECTIVE IMPAIRMENTS: decreased ROM, increased fascial restrictions, impaired flexibility, impaired UE functional use, and pain.   ACTIVITY LIMITATIONS: sitting and reach over head  PARTICIPATION LIMITATIONS: driving and occupation  PERSONAL FACTORS: Time since onset of injury/illness/exacerbation are also affecting patient's functional outcome.   REHAB POTENTIAL: Good  CLINICAL DECISION MAKING: Stable/uncomplicated  EVALUATION COMPLEXITY: Low   GOALS:   SHORT TERM GOALS: Target date: 08/13/2023   Pt will be independent and compliant with HEP for improved pain, strength, and function.  Baseline:  Goal status: PROGRESSING  11/14  2.  Pt will report at least a 25% improvement in pain and sx's overall.  Baseline:  Goal status: GOAL MET  11/14  3.  Pt will report reduced need of self-popping his neck for reduced tightness/pressure. Baseline:  Goal status:  PARTIALLY MET  11/14 Target date:  08/20/2023    LONG TERM GOALS: Target date: 10/17/2023  Pt's worst pain will be no > than 3/10.   Baseline:  Goal status: NOT MET   11/14  2.  Pt will report he is able to  turn head while driving without limitation and without pain.  Baseline:  Goal status:  NOT MET  11/14  3.  Pt will report at least a 70% improvement overall in sx's including with sitting.   Baseline:  Goal status:  PARTIALLY MET  11/14  4.  Pt will report he is able to consistently reach overhead without pain.  Baseline:  Goal status: INITIAL    PLAN:  PT FREQUENCY: 1-2x/week  PT DURATION: 6 weeks  PLANNED INTERVENTIONS: Therapeutic exercises, Therapeutic activity, Neuromuscular re-education, Patient/Family education, Self Care, Joint mobilization, Joint manipulation, Stair training, Aquatic Therapy, Dry Needling, Electrical stimulation, Spinal mobilization, Cryotherapy, Moist heat, Taping, Traction, Ultrasound, Manual therapy, and Re-evaluation  PLAN FOR NEXT SESSION: STW to UT, cervical paraspinals, and suboccipitals.  Postural strengthening and stabilization.  Cont with cupping and DN.  Pt is having a MRI on 12/19.     Reola Mosher Bowie Delia, PT 10/10/2023, 3:10 PM

## 2023-10-17 ENCOUNTER — Ambulatory Visit (HOSPITAL_BASED_OUTPATIENT_CLINIC_OR_DEPARTMENT_OTHER): Payer: BC Managed Care – PPO | Admitting: Physical Therapy

## 2023-10-17 DIAGNOSIS — M542 Cervicalgia: Secondary | ICD-10-CM | POA: Diagnosis not present

## 2023-10-17 NOTE — Therapy (Addendum)
 OUTPATIENT PHYSICAL THERAPY TREATMENT   Patient Name: Nicholas Alvarado MRN: [id number] DOB:Oct 06, 1981, 42 y.o., male Today's Date: 10/18/2023  END OF SESSION:  PT End of Session - 10/17/23 1511     Visit Number 11    Number of Visits 13    Date for PT Re-Evaluation 10/17/23    Authorization Type [employer]    PT Start Time 1505    PT Stop Time 1543    PT Time Calculation (min) 38 min    Activity Tolerance Patient tolerated treatment well    Behavior During Therapy [facility] for tasks assessed/performed                History reviewed. No pertinent past medical history.  Patient Active Problem List   Diagnosis Date Noted   GAD (generalized anxiety disorder) 07/02/2018   HTN (hypertension) 07/02/2018   Sleep apnea 07/02/2018     REFERRING PROVIDER: Allen Lauraine CROME, PA-C  REFERRING DIAG: M50.30 (ICD-10-CM) - Other cervical disc degeneration, unspecified cervical region  THERAPY DIAG:  Cervicalgia  Rationale for Evaluation and Treatment: Rehabilitation  ONSET DATE: PT order 06/18/2023  SUBJECTIVE:                                                                                                                                                                                                         SUBJECTIVE STATEMENT: Pt received MRI results yesterday.  Pt was getting a H/A yesterday and took a naproxen which helped.  Pt has been using his theracane.  Pt felt better after prior Rx.  Pt states the cervical retraction exercises helped.  Pt has been performing gym exercises.  He has been performing HEP occasionally.  Pt received his MRI results and is now scheduling with MD for injections.      Hand dominance: Right  PERTINENT HISTORY:  HTN, GAD  PAIN:  NPRS:  3/10 current, 5-6/10 at times with movement, Worst 6/10. Best 0/10. Location: cervical/UT Pt can occasionally have L shoulder pain.  Pt can have tingling in L UE in sitting and also playing video games.   Easing Factors:   naproxen, CBD cream, traction unit, self-popping his neck Aggravating factors:  sitting, turning neck, occasionally reaching up to grab an object  PRECAUTIONS: None     WEIGHT BEARING RESTRICTIONS: No  FALLS:  Has patient fallen in last 6 months? 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  Pt tries to get up every hour.  PLOF: Independent  PATIENT GOALS: reduce daily uncomfortable feeling.  Have tools to decrease pain.  Decrease the tension in neck/shoulders.  Reduce the need to pop his neck.   OBJECTIVE:  DIAGNOSTIC FINDINGS:   MRI on 10/10/23  Impression:   Mild degenerative changes.  C3-4:  small post disc osteophytic complex and uncovertebral disease. C4-5:  mild face arthrosis on R C5-6: mild loss of disc height with osteophytic complex. Small ant osteophyte.  Mild L neuroforaminal stenosis.   C6-7:  Uncovertebral disease results in mild R neuroforaminal stenosis.    Note:  X rays in 10/23:   FINDINGS: There is straightening of the cervical spine which is similar to the prior exam. The vertebral body heights are maintained. No fracture identified. Mild disc space narrowing with anterior osteophytes at C5-C6. Mild disc space narrowing at C6-C7.  No prevertebral soft tissue swelling.   IMPRESSION: Degenerative disc disease at C5-C6 and C6-C7 which is similar to the prior exam.      TODAY'S TREATMENT:                                                                                                                               10/17/23 Manual Therapy: -Reviewed pain and sx's, response to prior Rx, and home management strategies.  -Reviewed MRI findings on pt's phone.  See above for results. -static and dynamic cupping to bilat UT and cervical paraspinals seated to improve soft tissue tightness, reduce pain, and decrease myofascial restrictions and adhesions. -Cervical manual distraction in supine with bilat LE's elevated 3x1  min  Therapeutic exercise: Cervical retraction x 10 reps Cervical retraction with rotation x 10 reps bilat 4D ball rolls on wall 2x10 bilat Bilateral ER with scap retraction with black theraband 2 x 10    10/10/23 UBE x 4 mins lvl 1 (2 mins each fwd and bwd) Manual: STM to suboccipitals/paraspinals, R and L UPA C2-C6, R and L UPA upper t/sp and ribs Cervical retraction 1 x 10 Cervical retraction extension 2 x 10 Cervical retraction extension rotation 2 x 10   10/02/23 UBE x 4 mins lvl 1 (2 mins each fwd and bwd) Shoulder horizontal abduction 2 x 10 with black theraband Bilateral ER with scap retraction with black theraband 2 x 10  4D ball rolls on wall 2x10 bilat   Manual Therapy: -Reviewed pain and sx's, response to prior Rx, and home management strategies.  -static cupping to bilat UT and cervical paraspinals seated to improve soft tissue tightness, reduce pain, and decrease myofascial restrictions and adhesions. -Cervical manual distraction in supine with bilat LE's elevated 3x1 min  PATIENT EDUCATION:  Education details:  HEP, rationale of interventions, POC, and relevant anatomy. Person educated: Patient Education method: Medical illustrator, verbal cues Education comprehension: verbalized understanding, returned demonstration, and needs further education, verbal cues required  HOME EXERCISE PROGRAM: Access Code: [email or web address] URL: [email or web address] Date: 08/01/2023  - Seated Assisted Cervical Rotation with Towel  - 3 x daily - 7 x weekly - 10 reps -  5 second hold - Seated Cervical Retraction  - 3-5 x daily - 7 x weekly - 2 sets - 10 reps - Seated Upper Trapezius Stretch  - 3 x daily - 7 x weekly - 3 reps - 20 second hold  ASSESSMENT:  CLINICAL IMPRESSION: Patient received MRI results.  PT reviewed results on his phone, see above for details.  Pt is meeting with MD and planning to have injections.  He continues to have short term pain relief  with PT.  He feels better after PT treatment, though his pain returns.  Pt continues to have soft tissue tightness in bilat UT.  He had an appropriate response in bilat UT and cervical paraspinals with static and dynamic cupping.  PT instructed pt to drink plenty of water after treatment.  Pt performed exercises well without c/o's.  PT educated pt concerning POC and pt wants to be put on hold at this time due to receiving cervical injections.      OBJECTIVE IMPAIRMENTS: decreased ROM, increased fascial restrictions, impaired flexibility, impaired UE functional use, and pain.   ACTIVITY LIMITATIONS: sitting and reach over head  PARTICIPATION LIMITATIONS: driving and occupation  PERSONAL FACTORS: Time since onset of injury/illness/exacerbation are also affecting patient's functional outcome.   REHAB POTENTIAL: Good  CLINICAL DECISION MAKING: Stable/uncomplicated  EVALUATION COMPLEXITY: Low   GOALS:   SHORT TERM GOALS: Target date: 08/13/2023   Pt will be independent and compliant with HEP for improved pain, strength, and function.  Baseline:  Goal status: PROGRESSING  11/14  2.  Pt will report at least a 25% improvement in pain and sx's overall.  Baseline:  Goal status: GOAL MET  11/14  3.  Pt will report reduced need of self-popping his neck for reduced tightness/pressure. Baseline:  Goal status:  PARTIALLY MET  11/14 Target date:  08/20/2023    LONG TERM GOALS: Target date: 10/17/2023  Pt's worst pain will be no > than 3/10.   Baseline:  Goal status: NOT MET   11/14  2.  Pt will report he is able to turn head while driving without limitation and without pain.  Baseline:  Goal status:  NOT MET  11/14  3.  Pt will report at least a 70% improvement overall in sx's including with sitting.   Baseline:  Goal status:  PARTIALLY MET  11/14  4.  Pt will report he is able to consistently reach overhead without pain.  Baseline:  Goal status: INITIAL    PLAN:  PT  FREQUENCY: 1-2x/week  PT DURATION: 6 weeks  PLANNED INTERVENTIONS: Therapeutic exercises, Therapeutic activity, Neuromuscular re-education, Patient/Family education, Self Care, Joint mobilization, Joint manipulation, Stair training, Aquatic Therapy, Dry Needling, Electrical stimulation, Spinal mobilization, Cryotherapy, Moist heat, Taping, Traction, Ultrasound, Manual therapy, and Re-evaluation  PLAN FOR NEXT SESSION:  Pt will be put on hold at this time.  Pt to meet with MD and planning to have cervical injections.  PT will wait to hear back from Pt after seeing ME and will continue with PT per MD orders and/or pt response.    Leigh Minerva III PT, DPT 10/18/23 3:34 PM   PHYSICAL THERAPY DISCHARGE SUMMARY  Visits from Start of Care: 11  Current functional level related to goals / functional outcomes: See above for goals   Remaining deficits: See above   Education / Equipment: See above  Pt was seen in PT from 07/23/23 to 10/17/23.  He would receive short term benefits from PT.  He felt better after  PT treatments though the pain would return.  Pt was planning to have cervical injections and was put on hold.  PT has not heard back from pt considering his response to the injections.  Pt will be discharged from PT at this time.  He has a HEP and is performing a gym program.    Leigh Minerva III PT, DPT 07/06/24 8:37 AM

## 2023-10-18 ENCOUNTER — Encounter (HOSPITAL_BASED_OUTPATIENT_CLINIC_OR_DEPARTMENT_OTHER): Payer: Self-pay | Admitting: Physical Therapy
